# Patient Record
Sex: Female | Born: 1970 | Race: Black or African American | Hispanic: No | Smoking: Never smoker
Health system: Southern US, Community
[De-identification: ages and names within clinical notes are randomized; demographics above are authoritative.]

## PROBLEM LIST (undated history)

## (undated) DIAGNOSIS — F419 Anxiety disorder, unspecified: Secondary | ICD-10-CM

## (undated) DIAGNOSIS — F319 Bipolar disorder, unspecified: Secondary | ICD-10-CM

## (undated) DIAGNOSIS — F32A Depression, unspecified: Secondary | ICD-10-CM

## (undated) DIAGNOSIS — R569 Unspecified convulsions: Secondary | ICD-10-CM

## (undated) DIAGNOSIS — M1990 Unspecified osteoarthritis, unspecified site: Secondary | ICD-10-CM

## (undated) DIAGNOSIS — F329 Major depressive disorder, single episode, unspecified: Secondary | ICD-10-CM

## (undated) DIAGNOSIS — E079 Disorder of thyroid, unspecified: Secondary | ICD-10-CM

## (undated) HISTORY — PX: THYROIDECTOMY: SHX17

---

## 1994-12-21 ENCOUNTER — Emergency Department: Admit: 1994-12-21 | Payer: Self-pay

## 1995-02-10 ENCOUNTER — Emergency Department: Admit: 1995-02-10 | Payer: Self-pay | Source: Ambulatory Visit

## 1995-05-21 ENCOUNTER — Emergency Department: Admit: 1995-05-21 | Payer: Self-pay | Admitting: Psychiatry

## 1999-10-20 ENCOUNTER — Emergency Department: Admit: 1999-10-20 | Payer: Self-pay | Admitting: Emergency Medicine

## 2006-12-24 HISTORY — PX: ANTERIOR CRUCIATE LIGAMENT REPAIR: SHX115

## 2009-12-27 ENCOUNTER — Inpatient Hospital Stay (HOSPITAL_COMMUNITY): Admission: EM | Admit: 2009-12-27 | Discharge: 2009-12-30 | Payer: Self-pay | Admitting: Psychiatry

## 2009-12-27 ENCOUNTER — Ambulatory Visit: Payer: Self-pay | Admitting: Psychiatry

## 2010-09-29 ENCOUNTER — Encounter
Admission: RE | Admit: 2010-09-29 | Discharge: 2010-11-08 | Payer: Self-pay | Source: Home / Self Care | Admitting: Sports Medicine

## 2011-01-13 ENCOUNTER — Encounter: Payer: Self-pay | Admitting: Sports Medicine

## 2011-01-31 ENCOUNTER — Other Ambulatory Visit: Payer: Self-pay | Admitting: Sports Medicine

## 2011-01-31 DIAGNOSIS — M25561 Pain in right knee: Secondary | ICD-10-CM

## 2011-01-31 DIAGNOSIS — M545 Low back pain: Secondary | ICD-10-CM

## 2011-01-31 DIAGNOSIS — M25571 Pain in right ankle and joints of right foot: Secondary | ICD-10-CM

## 2011-02-03 ENCOUNTER — Other Ambulatory Visit: Payer: Self-pay

## 2011-02-17 ENCOUNTER — Ambulatory Visit
Admission: RE | Admit: 2011-02-17 | Discharge: 2011-02-17 | Disposition: A | Discharge status: Home / Self Care | Payer: Medicaid Other | Source: Ambulatory Visit | Attending: Sports Medicine | Admitting: Sports Medicine

## 2011-02-17 DIAGNOSIS — M545 Low back pain: Secondary | ICD-10-CM

## 2011-02-17 DIAGNOSIS — M25571 Pain in right ankle and joints of right foot: Secondary | ICD-10-CM

## 2011-02-17 DIAGNOSIS — M25561 Pain in right knee: Secondary | ICD-10-CM

## 2011-02-28 ENCOUNTER — Ambulatory Visit: Payer: Medicaid Other | Attending: Sports Medicine | Admitting: Physical Therapy

## 2011-02-28 DIAGNOSIS — M545 Low back pain, unspecified: Secondary | ICD-10-CM | POA: Insufficient documentation

## 2011-02-28 DIAGNOSIS — M6281 Muscle weakness (generalized): Secondary | ICD-10-CM | POA: Insufficient documentation

## 2011-02-28 DIAGNOSIS — M25569 Pain in unspecified knee: Secondary | ICD-10-CM | POA: Insufficient documentation

## 2011-02-28 DIAGNOSIS — IMO0001 Reserved for inherently not codable concepts without codable children: Secondary | ICD-10-CM | POA: Insufficient documentation

## 2011-03-11 LAB — COMPREHENSIVE METABOLIC PANEL
AST: 17 U/L (ref 0–37)
Albumin: 3.8 g/dL (ref 3.5–5.2)
Calcium: 9.1 mg/dL (ref 8.4–10.5)
Creatinine, Ser: 0.76 mg/dL (ref 0.4–1.2)
GFR calc Af Amer: >60 mL/min (ref >60)
Sodium: 139 mEq/L (ref 135–145)
Total Protein: 7.7 g/dL (ref 6.0–8.3)

## 2011-03-11 LAB — TSH: TSH: 1.874 u[IU]/mL (ref 0.350–4.500)

## 2011-03-11 LAB — VALPROIC ACID LEVEL: Valproic Acid Lvl: 64.5 ug/mL (ref 50.0–100.0)

## 2011-03-11 LAB — CBC
MCHC: 32.6 g/dL (ref 30.0–36.0)
MCV: 91.4 fL (ref 78.0–100.0)
Platelets: 158 10*3/uL (ref 150–400)

## 2011-03-11 LAB — SALICYLATE LEVEL: Salicylate Lvl: <4 mg/dL (ref 2.8–20.0)

## 2011-03-14 ENCOUNTER — Ambulatory Visit: Payer: Medicaid Other | Admitting: Physical Therapy

## 2011-03-20 ENCOUNTER — Ambulatory Visit: Payer: Medicaid Other | Admitting: Physical Therapy

## 2011-03-27 ENCOUNTER — Ambulatory Visit: Payer: Medicaid Other | Attending: Specialist | Admitting: Physical Therapy

## 2011-03-27 DIAGNOSIS — M6281 Muscle weakness (generalized): Secondary | ICD-10-CM | POA: Insufficient documentation

## 2011-03-27 DIAGNOSIS — M545 Low back pain, unspecified: Secondary | ICD-10-CM | POA: Insufficient documentation

## 2011-03-27 DIAGNOSIS — IMO0001 Reserved for inherently not codable concepts without codable children: Secondary | ICD-10-CM | POA: Insufficient documentation

## 2011-03-27 DIAGNOSIS — M25569 Pain in unspecified knee: Secondary | ICD-10-CM | POA: Insufficient documentation

## 2011-04-10 ENCOUNTER — Ambulatory Visit: Payer: Medicaid Other | Admitting: Physical Therapy

## 2011-05-03 ENCOUNTER — Emergency Department (HOSPITAL_BASED_OUTPATIENT_CLINIC_OR_DEPARTMENT_OTHER)
Admission: EM | Admit: 2011-05-03 | Discharge: 2011-05-03 | Disposition: A | Discharge status: Home / Self Care | Payer: Medicaid Other | Attending: Emergency Medicine | Admitting: Emergency Medicine

## 2011-05-03 DIAGNOSIS — R112 Nausea with vomiting, unspecified: Secondary | ICD-10-CM | POA: Insufficient documentation

## 2011-05-03 DIAGNOSIS — F319 Bipolar disorder, unspecified: Secondary | ICD-10-CM | POA: Insufficient documentation

## 2011-05-03 DIAGNOSIS — R197 Diarrhea, unspecified: Secondary | ICD-10-CM | POA: Insufficient documentation

## 2011-05-03 DIAGNOSIS — N72 Inflammatory disease of cervix uteri: Secondary | ICD-10-CM | POA: Insufficient documentation

## 2011-05-03 LAB — CBC
HCT: 40.3 % (ref 36.0–46.0)
MCV: 81.4 fL (ref 78.0–100.0)
RBC: 4.95 MIL/uL (ref 3.87–5.11)
RDW: 12.7 % (ref 11.5–15.5)
WBC: 5.4 10*3/uL (ref 4.0–10.5)

## 2011-05-03 LAB — COMPREHENSIVE METABOLIC PANEL
Alkaline Phosphatase: 64 U/L (ref 39–117)
BUN: 6 mg/dL (ref 6–23)
Chloride: 98 mEq/L (ref 96–112)
Glucose, Bld: 98 mg/dL (ref 70–99)
Potassium: 3.7 mEq/L (ref 3.5–5.1)
Total Bilirubin: 0.3 mg/dL (ref 0.3–1.2)

## 2011-05-03 LAB — URINALYSIS, ROUTINE W REFLEX MICROSCOPIC
Glucose, UA: NEGATIVE mg/dL
Ketones, ur: NEGATIVE mg/dL
Protein, ur: NEGATIVE mg/dL
Urobilinogen, UA: 0.2 mg/dL (ref 0.0–1.0)

## 2011-05-03 LAB — DIFFERENTIAL
Eosinophils Relative: 0 % (ref 0–5)
Lymphocytes Relative: 7 % — ABNORMAL LOW (ref 12–46)
Lymphs Abs: 0.4 10*3/uL — ABNORMAL LOW (ref 0.7–4.0)
Monocytes Relative: 12 % (ref 3–12)

## 2011-05-03 LAB — PREGNANCY, URINE: Preg Test, Ur: NEGATIVE

## 2011-05-03 LAB — URINE MICROSCOPIC-ADD ON

## 2011-05-03 LAB — WET PREP, GENITAL: Yeast Wet Prep HPF POC: NONE SEEN

## 2011-05-03 LAB — LIPASE, BLOOD: Lipase: 18 U/L (ref 11–59)

## 2011-08-18 ENCOUNTER — Emergency Department (INDEPENDENT_AMBULATORY_CARE_PROVIDER_SITE_OTHER): Payer: Medicaid Other

## 2011-08-18 ENCOUNTER — Emergency Department (HOSPITAL_BASED_OUTPATIENT_CLINIC_OR_DEPARTMENT_OTHER)
Admission: EM | Admit: 2011-08-18 | Discharge: 2011-08-18 | Disposition: A | Discharge status: Home / Self Care | Payer: Medicaid Other | Attending: Emergency Medicine | Admitting: Emergency Medicine

## 2011-08-18 ENCOUNTER — Other Ambulatory Visit (HOSPITAL_BASED_OUTPATIENT_CLINIC_OR_DEPARTMENT_OTHER): Payer: Medicaid Other

## 2011-08-18 ENCOUNTER — Encounter: Payer: Self-pay | Admitting: *Deleted

## 2011-08-18 DIAGNOSIS — F411 Generalized anxiety disorder: Secondary | ICD-10-CM | POA: Insufficient documentation

## 2011-08-18 DIAGNOSIS — J45909 Unspecified asthma, uncomplicated: Secondary | ICD-10-CM | POA: Insufficient documentation

## 2011-08-18 DIAGNOSIS — Z79899 Other long term (current) drug therapy: Secondary | ICD-10-CM | POA: Insufficient documentation

## 2011-08-18 DIAGNOSIS — M940 Chondrocostal junction syndrome [Tietze]: Secondary | ICD-10-CM

## 2011-08-18 DIAGNOSIS — R079 Chest pain, unspecified: Secondary | ICD-10-CM | POA: Insufficient documentation

## 2011-08-18 DIAGNOSIS — E079 Disorder of thyroid, unspecified: Secondary | ICD-10-CM | POA: Insufficient documentation

## 2011-08-18 DIAGNOSIS — F319 Bipolar disorder, unspecified: Secondary | ICD-10-CM | POA: Insufficient documentation

## 2011-08-18 DIAGNOSIS — N390 Urinary tract infection, site not specified: Secondary | ICD-10-CM | POA: Insufficient documentation

## 2011-08-18 DIAGNOSIS — R1011 Right upper quadrant pain: Secondary | ICD-10-CM | POA: Insufficient documentation

## 2011-08-18 HISTORY — DX: Anxiety disorder, unspecified: F41.9

## 2011-08-18 HISTORY — DX: Disorder of thyroid, unspecified: E07.9

## 2011-08-18 HISTORY — DX: Bipolar disorder, unspecified: F31.9

## 2011-08-18 LAB — URINALYSIS, ROUTINE W REFLEX MICROSCOPIC
Ketones, ur: NEGATIVE mg/dL
Nitrite: NEGATIVE
Specific Gravity, Urine: 1.019 (ref 1.005–1.030)
pH: 6 (ref 5.0–8.0)

## 2011-08-18 LAB — CBC
Hemoglobin: 14 g/dL (ref 12.0–15.0)
MCH: 28.9 pg (ref 26.0–34.0)
MCV: 85.2 fL (ref 78.0–100.0)
RBC: 4.85 MIL/uL (ref 3.87–5.11)

## 2011-08-18 LAB — DIFFERENTIAL
Basophils Absolute: 0 10*3/uL (ref 0.0–0.1)
Basophils Relative: 0 % (ref 0–1)
Eosinophils Relative: 2 % (ref 0–5)
Monocytes Absolute: 0.4 10*3/uL (ref 0.1–1.0)

## 2011-08-18 LAB — COMPREHENSIVE METABOLIC PANEL
AST: 19 U/L (ref 0–37)
Albumin: 3.6 g/dL (ref 3.5–5.2)
Calcium: 9.8 mg/dL (ref 8.4–10.5)
Creatinine, Ser: 0.7 mg/dL (ref 0.50–1.10)

## 2011-08-18 LAB — D-DIMER, QUANTITATIVE: D-Dimer, Quant: 0.45 ug/mL-FEU (ref 0.00–0.48)

## 2011-08-18 LAB — URINE MICROSCOPIC-ADD ON

## 2011-08-18 MED ORDER — NAPROXEN 500 MG PO TABS: 500.0000 mg | ORAL_TABLET | Freq: Two times a day (BID) | ORAL | Status: AC

## 2011-08-18 MED ORDER — HYDROCODONE-ACETAMINOPHEN 5-500 MG PO TABS: 1.0000 | ORAL_TABLET | Freq: Four times a day (QID) | ORAL | Status: AC | PRN

## 2011-08-18 MED ORDER — MORPHINE SULFATE 4 MG/ML IJ SOLN
4.0000 mg | Freq: Once | INTRAMUSCULAR | Status: AC
Start: 2011-08-18 — End: 2011-08-18
  Administered 2011-08-18: 4 mg via INTRAVENOUS
  Filled 2011-08-18: qty 1

## 2011-08-18 MED ORDER — KETOROLAC TROMETHAMINE 30 MG/ML IJ SOLN
30.0000 mg | Freq: Once | INTRAMUSCULAR | Status: AC
Start: 2011-08-18 — End: 2011-08-18
  Administered 2011-08-18: 30 mg via INTRAVENOUS
  Filled 2011-08-18: qty 1

## 2011-08-18 MED ORDER — SODIUM CHLORIDE 0.9 % IV BOLUS (SEPSIS)
1000.0000 mL | Freq: Once | INTRAVENOUS | Status: AC
  Administered 2011-08-18: 1000 mL via INTRAVENOUS

## 2011-08-18 MED ORDER — NITROFURANTOIN MONOHYD MACRO 100 MG PO CAPS: 100.0000 mg | ORAL_CAPSULE | Freq: Two times a day (BID) | ORAL | Status: AC

## 2011-08-18 MED ORDER — NITROFURANTOIN MONOHYD MACRO 100 MG PO CAPS
100.0000 mg | ORAL_CAPSULE | Freq: Once | ORAL | Status: AC
  Administered 2011-08-18: 100 mg via ORAL
  Filled 2011-08-18: qty 1

## 2011-08-18 NOTE — ED Notes (Signed)
Pt describes heaviness in chest onset 11:00 a.m. While lying down. Hurts to move or take a deep breath. Also c/o H/A (migraine)

## 2011-08-18 NOTE — ED Provider Notes (Signed)
History   Chart scribed for Dayton Bailiff, MD by Dayton Bailiff; the patient was seen in room MH06/MH06; this patient's care was started at 7:37 PM.    CSN: [id number] Arrival date & time: 08/18/2011  7:03 PM  Chief Complaint  Patient presents with  . Chest Pain   HPI Rebecca Solis is a 40 y.o. female who presents to the Emergency Department complaining of chest pain. Pt c/o right sided chest pain through to back and RUQ abd was sudden in onset while lying down approx 9 hours ago. Pt describes chest pain as a heaviness that is worse with movement and deep breathing. Pain not worse with eating. No n/v, sob, cough, or congestion. Also c/o HA x 3 days that is same as usual migraines.   PAST MEDICAL HISTORY:  Past Medical History  Diagnosis Date  . Asthma   . Thyroid disease   . Bipolar affective disorder   . Anxiety      PAST SURGICAL HISTORY:  Past Surgical History  Procedure Date  . Thyroidectomy     MEDICATIONS:  Previous Medications   IBUPROFEN (ADVIL,MOTRIN) 800 MG TABLET    Take 800 mg by mouth once as needed. For pain     LEVONORGESTREL (MIRENA) 20 MCG/24HR IUD    1 each by Intrauterine route once. Inserted September 2008    QUETIAPINE (SEROQUEL) 200 MG TABLET    Take 400 mg by mouth daily.     ZOLPIDEM (AMBIEN) 10 MG TABLET    Take 10 mg by mouth at bedtime as needed. For sleep       ALLERGIES:  Allergies as of 08/18/2011  . (No Known Allergies)     FAMILY HISTORY:  History reviewed. No pertinent family history.   SOCIAL HISTORY: History   Social History  . Marital Status: Single    Spouse Name: N/A    Number of Children: N/A  . Years of Education: N/A   Occupational History  . Not on file.   Social History Main Topics  . Smoking status: Former Games developer  . Smokeless tobacco: Not on file  . Alcohol Use: No  . Drug Use: No  . Sexually Active: Yes    Birth Control/ Protection: IUD   Other Topics Concern  . Not on file   Social History Narrative  .  No narrative on file       Review of Systems 10 Systems reviewed and are negative for acute change except as noted in the HPI.  Physical Exam  BP 120/78  Pulse 88  Temp(Src) 98 F (36.7 C) (Oral)  Resp 20  Ht 5\' 7"  (1.702 m)  Wt 180 lb (81.647 kg)  BMI 28.19 kg/m2  SpO2 100%  LMP 07/28/2011  Physical Exam  Nursing note and vitals reviewed. Constitutional: She is oriented to person, place, and time. She appears well-developed and well-nourished. No distress.       Uncomfortable appearing  HENT:  Head: Normocephalic.  Mouth/Throat: Mucous membranes are normal.  Eyes: Conjunctivae are normal.  Neck: Normal range of motion. Neck supple.  Cardiovascular: Normal rate, regular rhythm and intact distal pulses.  Exam reveals no gallop and no friction rub.   No murmur heard. Pulmonary/Chest: Effort normal and breath sounds normal. She has no wheezes. She has no rales. She exhibits no tenderness.  Abdominal: Soft. Bowel sounds are normal. There is tenderness (RUQ).       Right CVA tenderness  Musculoskeletal: Normal range of motion.  Neurological: She is alert  and oriented to person, place, and time.  Skin: Skin is warm and dry. No rash noted.  Psychiatric: She has a normal mood and affect.    ED Course  Procedures  OTHER DATA REVIEWED: Nursing notes and vital signs reviewed. Prior records reviewed.   DIAGNOSTIC STUDIES: Oxygen Saturation is 100% on room air, normal by my Interpretation.   LABS / RADIOLOGY: Results for orders placed during the hospital encounter of 08/18/11  CBC      Component Value Range   WBC 4.5  4.0 - 10.5 (K/uL)   RBC 4.85  3.87 - 5.11 (MIL/uL)   Hemoglobin 14.0  12.0 - 15.0 (g/dL)   HCT 14.7  82.9 - 56.2 (%)   MCV 85.2  78.0 - 100.0 (fL)   MCH 28.9  26.0 - 34.0 (pg)   MCHC 33.9  30.0 - 36.0 (g/dL)   RDW 13.0  86.5 - 78.4 (%)   Platelets 193  150 - 400 (K/uL)  DIFFERENTIAL      Component Value Range   Neutrophils Relative 42 (*) 43 - 77  (%)   Neutro Abs 1.9  1.7 - 7.7 (K/uL)   Lymphocytes Relative 46  12 - 46 (%)   Lymphs Abs 2.1  0.7 - 4.0 (K/uL)   Monocytes Relative 10  3 - 12 (%)   Monocytes Absolute 0.4  0.1 - 1.0 (K/uL)   Eosinophils Relative 2  0 - 5 (%)   Eosinophils Absolute 0.1  0.0 - 0.7 (K/uL)   Basophils Relative 0  0 - 1 (%)   Basophils Absolute 0.0  0.0 - 0.1 (K/uL)  COMPREHENSIVE METABOLIC PANEL      Component Value Range   Sodium 138  135 - 145 (mEq/L)   Potassium 3.8  3.5 - 5.1 (mEq/L)   Chloride 103  96 - 112 (mEq/L)   CO2 24  19 - 32 (mEq/L)   Glucose, Bld 83  70 - 99 (mg/dL)   BUN 9  6 - 23 (mg/dL)   Creatinine, Ser 6.96  0.50 - 1.10 (mg/dL)   Calcium 9.8  8.4 - 29.5 (mg/dL)   Total Protein 7.6  6.0 - 8.3 (g/dL)   Albumin 3.6  3.5 - 5.2 (g/dL)   AST 19  0 - 37 (U/L)   ALT 10  0 - 35 (U/L)   Alkaline Phosphatase 55  39 - 117 (U/L)   Total Bilirubin 0.3  0.3 - 1.2 (mg/dL)   GFR calc non Af Amer >60  >60 (mL/min)   GFR calc Af Amer >60  >60 (mL/min)  LIPASE, BLOOD      Component Value Range   Lipase 29  11 - 59 (U/L)  D-DIMER, QUANTITATIVE      Component Value Range   D-Dimer, Quant 0.45  0.00 - 0.48 (ug/mL-FEU)  URINALYSIS, ROUTINE W REFLEX MICROSCOPIC      Component Value Range   Color, Urine YELLOW  YELLOW    Appearance CLOUDY (*) CLEAR    Specific Gravity, Urine 1.019  1.005 - 1.030    pH 6.0  5.0 - 8.0    Glucose, UA NEGATIVE  NEGATIVE (mg/dL)   Hgb urine dipstick NEGATIVE  NEGATIVE    Bilirubin Urine NEGATIVE  NEGATIVE    Ketones, ur NEGATIVE  NEGATIVE (mg/dL)   Protein, ur NEGATIVE  NEGATIVE (mg/dL)   Urobilinogen, UA 0.2  0.0 - 1.0 (mg/dL)   Nitrite NEGATIVE  NEGATIVE    Leukocytes, UA MODERATE (*) NEGATIVE   URINE  MICROSCOPIC-ADD ON      Component Value Range   Squamous Epithelial / LPF MANY (*) RARE    WBC, UA 3-6  <3 (WBC/hpf)   Bacteria, UA MANY (*) RARE    Dg Chest 2 View  08/18/2011  *RADIOLOGY REPORT*  Clinical Data: Chest pain  CHEST - 2 VIEW  Comparison: None.   Findings: Cardiac and mediastinal contours are normal.  Vascularity is normal.  Lungs are clear without infiltrate or effusion.  IMPRESSION: No active cardiopulmonary disease.  Original Report Authenticated By: Camelia Phenes, M.D.    ED COURSE: Patient received pain medication one emergency department. She had improvement of her symptoms. She had a son have a urinary tract infection which was treated with Macrobid. I explained the results of the testing to the patient explained her diagnosis of costochondritis urinary tract infection. The patient had no further questions. I encouraged the importance of followup  MDM: Life-threatening causes of chest pain were considered such as acute coronary syndrome, pulmonary embolism. A d-dimer was performed and negative. Based on the type of pain, location, reproducibility this is not consistent with coronary artery disease. Patient is also much too young. EKG was performed and unremarkable. Chest x-ray was negative. Patient is safe for discharge home. She is instructed to followup.   Date: 08/18/2011  Rate: 88  Rhythm: normal sinus rhythm  QRS Axis: normal  Intervals: normal  ST/T Wave abnormalities: normal  Conduction Disutrbances:none  Narrative Interpretation:   Old EKG Reviewed: none available  IMPRESSION: 1. Urinary tract infection   2. Costochondritis      PLAN: discharge All results reviewed and discussed with pt, questions answered, pt agreeable with plan.   CONDITION ON DISCHARGE: stable   MEDS GIVEN IN ED: sodium chloride 0.9 % bolus 1,000 mL (1000 mL Intravenous Given 08/18/11 1951)  ketorolac (TORADOL) injection 30 mg (30 mg Intravenous Given 08/18/11 1952)  morphine injection 4 mg (4 mg Intravenous Given 08/18/11 1952)  nitrofurantoin (macrocrystal-monohydrate) (MACROBID) capsule 100 mg (100 mg Oral Given 08/18/11 2045)     DISCHARGE MEDICATIONS: New Prescriptions   HYDROCODONE-ACETAMINOPHEN (VICODIN) 5-500 MG PER TABLET     Take 1-2 tablets by mouth every 6 (six) hours as needed for pain.   NAPROXEN (NAPROSYN) 500 MG TABLET    Take 1 tablet (500 mg total) by mouth 2 (two) times daily.   NITROFURANTOIN, MACROCRYSTAL-MONOHYDRATE, (MACROBID) 100 MG CAPSULE    Take 1 capsule (100 mg total) by mouth 2 (two) times daily.     SCRIBE ATTESTATION: I personally performed the services described in this documentation, which was scribed in my presence. The recorded information has been reviewed and considered.      Dayton Bailiff, MD 08/18/11 2228

## 2012-04-26 IMAGING — CR DG CHEST 2V
2 series · 2 of 2 positions shown · non-contrast
Comparison: None.

CLINICAL DATA: Chest pain

CHEST - 2 VIEW

[w chest pa]
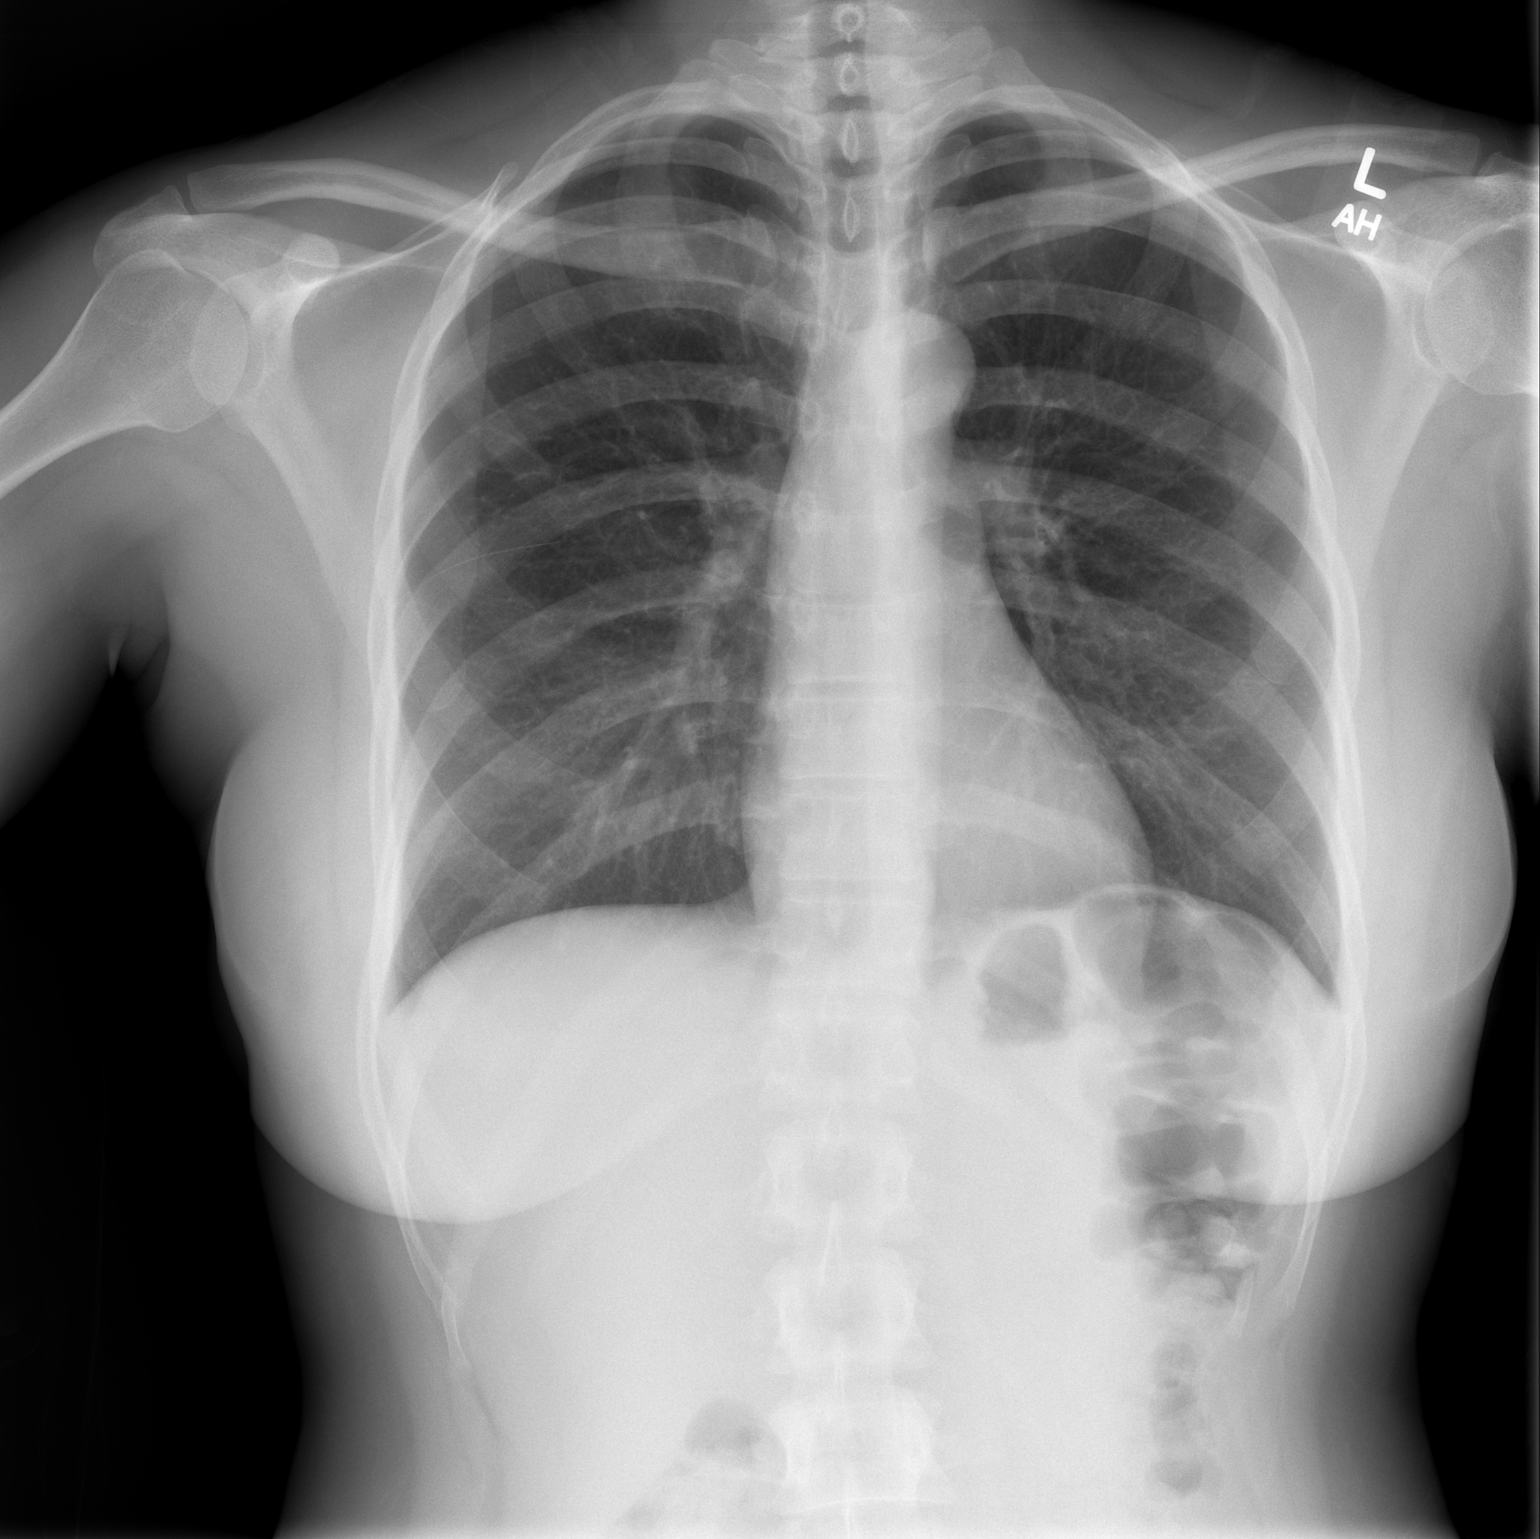

[w chest lat]
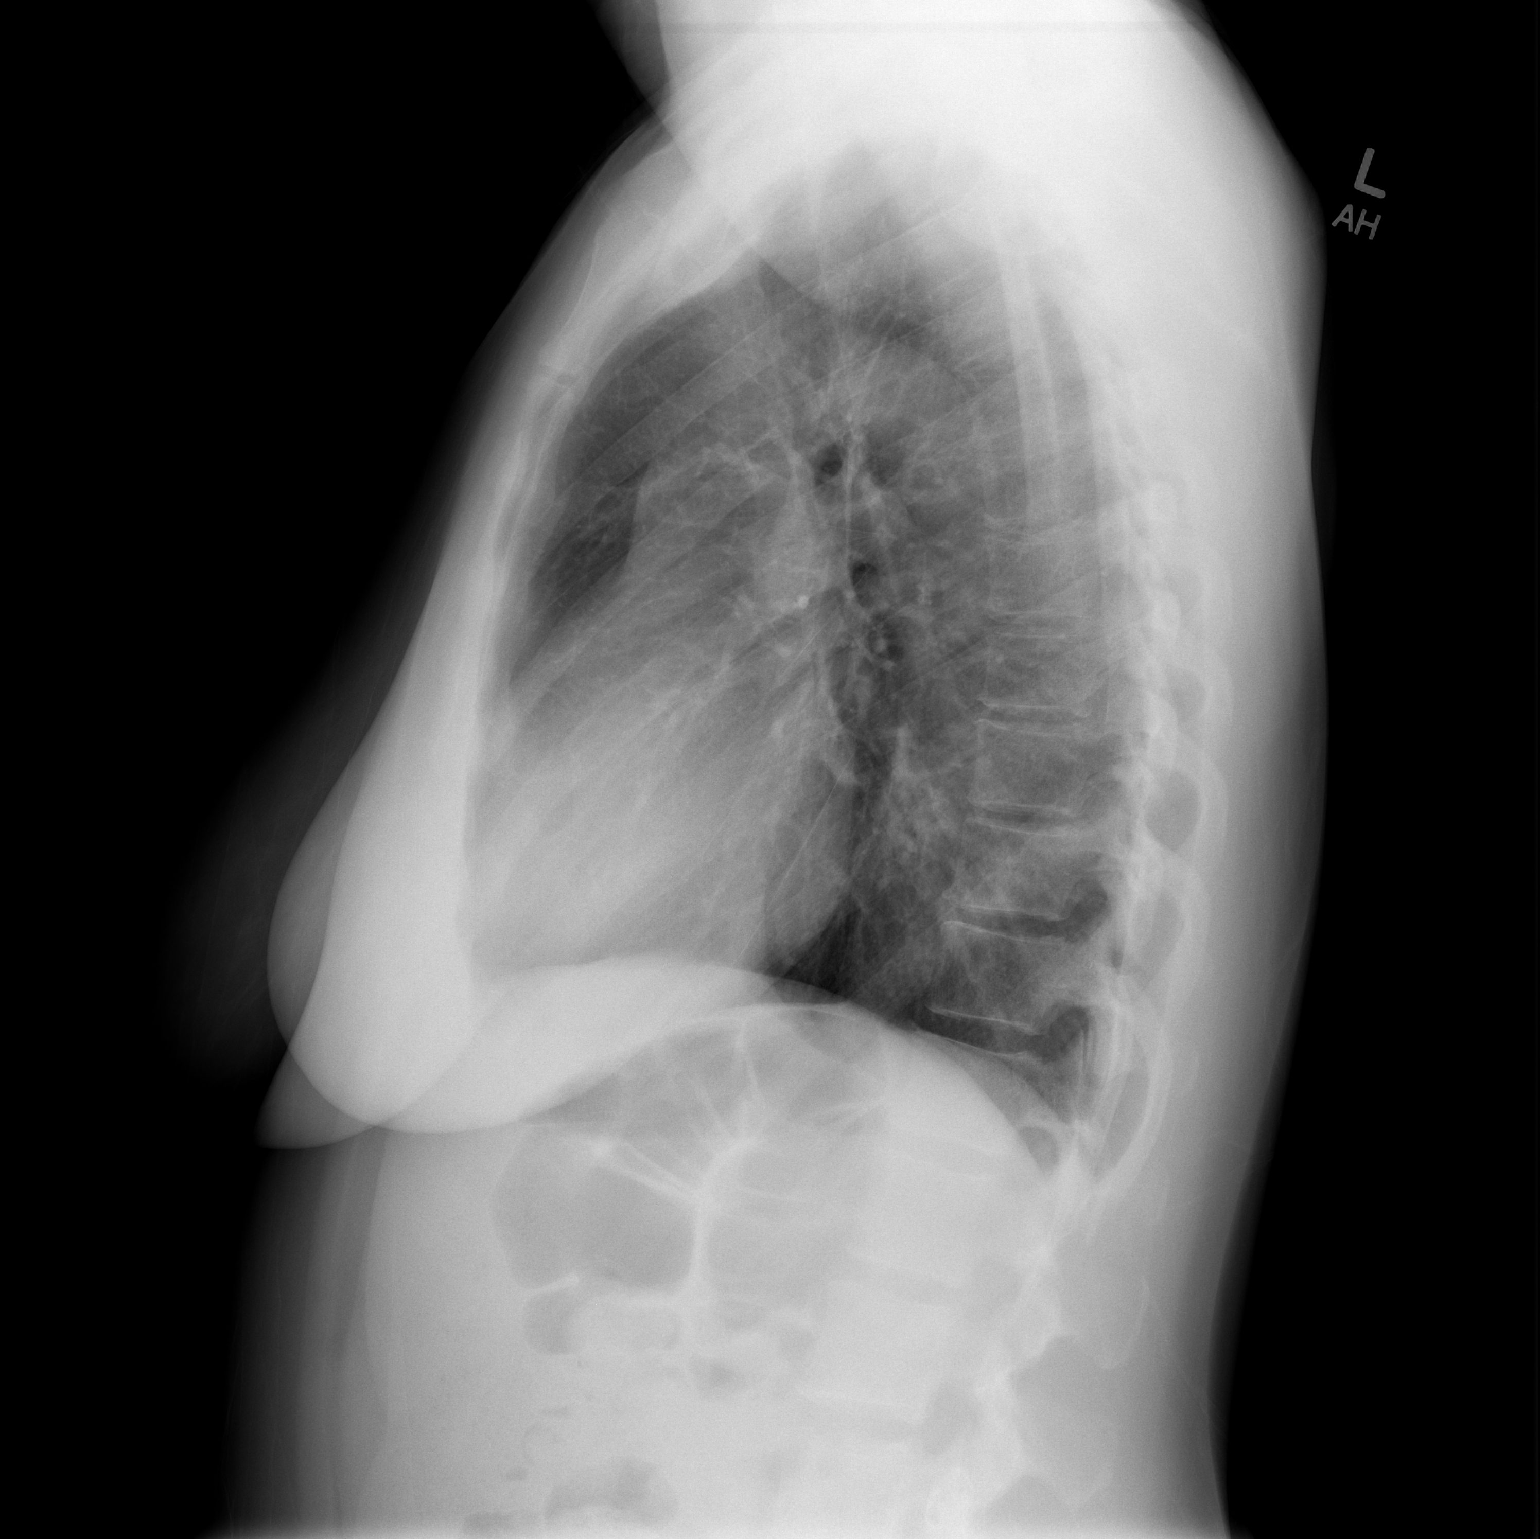

[2 of 2 positions shown; findings below may reference images not displayed]

FINDINGS: Cardiac and mediastinal contours are normal.  Vascularity
is normal.  Lungs are clear without infiltrate or effusion.
IMPRESSION: No active cardiopulmonary disease.

## 2012-08-24 HISTORY — PX: LEEP: SHX91

## 2013-06-22 ENCOUNTER — Encounter (HOSPITAL_COMMUNITY): Payer: Self-pay | Admitting: *Deleted

## 2013-06-22 ENCOUNTER — Emergency Department (HOSPITAL_COMMUNITY)
Admission: EM | Admit: 2013-06-22 | Discharge: 2013-06-23 | Disposition: A | Discharge status: Other Acute Inpatient Hospital | Payer: Medicaid Other | Attending: Emergency Medicine | Admitting: Emergency Medicine

## 2013-06-22 DIAGNOSIS — Z79899 Other long term (current) drug therapy: Secondary | ICD-10-CM | POA: Insufficient documentation

## 2013-06-22 DIAGNOSIS — F339 Major depressive disorder, recurrent, unspecified: Secondary | ICD-10-CM

## 2013-06-22 DIAGNOSIS — Z3202 Encounter for pregnancy test, result negative: Secondary | ICD-10-CM | POA: Insufficient documentation

## 2013-06-22 DIAGNOSIS — Z87891 Personal history of nicotine dependence: Secondary | ICD-10-CM | POA: Insufficient documentation

## 2013-06-22 DIAGNOSIS — J45909 Unspecified asthma, uncomplicated: Secondary | ICD-10-CM | POA: Insufficient documentation

## 2013-06-22 DIAGNOSIS — Z862 Personal history of diseases of the blood and blood-forming organs and certain disorders involving the immune mechanism: Secondary | ICD-10-CM | POA: Insufficient documentation

## 2013-06-22 DIAGNOSIS — F411 Generalized anxiety disorder: Secondary | ICD-10-CM | POA: Insufficient documentation

## 2013-06-22 DIAGNOSIS — M549 Dorsalgia, unspecified: Secondary | ICD-10-CM | POA: Insufficient documentation

## 2013-06-22 DIAGNOSIS — Z8639 Personal history of other endocrine, nutritional and metabolic disease: Secondary | ICD-10-CM | POA: Insufficient documentation

## 2013-06-22 DIAGNOSIS — F319 Bipolar disorder, unspecified: Secondary | ICD-10-CM | POA: Insufficient documentation

## 2013-06-22 DIAGNOSIS — F39 Unspecified mood [affective] disorder: Secondary | ICD-10-CM | POA: Insufficient documentation

## 2013-06-22 DIAGNOSIS — F3132 Bipolar disorder, current episode depressed, moderate: Secondary | ICD-10-CM

## 2013-06-22 LAB — SALICYLATE LEVEL: Salicylate Lvl: <2 mg/dL — ABNORMAL LOW (ref 2.8–20.0)

## 2013-06-22 LAB — RAPID URINE DRUG SCREEN, HOSP PERFORMED
Cocaine: NOT DETECTED
Opiates: NOT DETECTED

## 2013-06-22 LAB — COMPREHENSIVE METABOLIC PANEL
ALT: 8 U/L (ref 0–35)
BUN: 8 mg/dL (ref 6–23)
Calcium: 9.5 mg/dL (ref 8.4–10.5)
Creatinine, Ser: 0.73 mg/dL (ref 0.50–1.10)
GFR calc Af Amer: >90 mL/min (ref >90)
Glucose, Bld: 102 mg/dL — ABNORMAL HIGH (ref 70–99)
Sodium: 137 mEq/L (ref 135–145)
Total Protein: 7.6 g/dL (ref 6.0–8.3)

## 2013-06-22 LAB — CBC
Hemoglobin: 13.9 g/dL (ref 12.0–15.0)
MCH: 27.5 pg (ref 26.0–34.0)
MCHC: 33.4 g/dL (ref 30.0–36.0)
MCV: 82.4 fL (ref 78.0–100.0)

## 2013-06-22 LAB — ETHANOL: Alcohol, Ethyl (B): <11 mg/dL (ref 0–11)

## 2013-06-22 MED ORDER — LORATADINE 10 MG PO TABS
10.0000 mg | ORAL_TABLET | Freq: Every day | ORAL | Status: DC
  Administered 2013-06-22 – 2013-06-23 (×2): 10 mg via ORAL
  Filled 2013-06-22 (×2): qty 1

## 2013-06-22 MED ORDER — ZIPRASIDONE MESYLATE 20 MG IM SOLR
20.0000 mg | Freq: Once | INTRAMUSCULAR | Status: AC
  Administered 2013-06-22: 20 mg via INTRAMUSCULAR
  Filled 2013-06-22: qty 20

## 2013-06-22 MED ORDER — LORAZEPAM 1 MG PO TABS
1.0000 mg | ORAL_TABLET | Freq: Once | ORAL | Status: AC
  Administered 2013-06-22: 1 mg via SUBLINGUAL
  Filled 2013-06-22: qty 1

## 2013-06-22 MED ORDER — ALBUTEROL SULFATE HFA 108 (90 BASE) MCG/ACT IN AERS: 2.0000 | INHALATION_SPRAY | Freq: Four times a day (QID) | RESPIRATORY_TRACT | Status: DC | PRN

## 2013-06-22 MED ORDER — DULOXETINE HCL 30 MG PO CPEP
30.0000 mg | ORAL_CAPSULE | Freq: Every day | ORAL | Status: DC
  Administered 2013-06-22 – 2013-06-23 (×2): 30 mg via ORAL
  Filled 2013-06-22 (×2): qty 1

## 2013-06-22 MED ORDER — LORAZEPAM 1 MG PO TABS: 2.0000 mg | ORAL_TABLET | Freq: Four times a day (QID) | ORAL | Status: DC | PRN

## 2013-06-22 NOTE — Consult Note (Deleted)
Rebecca Solis is an 42 y.o. female.  HPI:   Past Medical History  Diagnosis Date  . Asthma   . Thyroid disease   . Bipolar affective disorder   . Anxiety     Past Surgical History  Procedure Laterality Date  . Thyroidectomy      History reviewed. No pertinent family history.  Social History:  reports that she has quit smoking. She does not have any smokeless tobacco history on file. She reports that she does not drink alcohol or use illicit drugs.  Allergies: No Known Allergies  Medications: I have reviewed the patient's current medications.  Results for orders placed during the hospital encounter of 06/22/13 (from the past 48 hour(s))  ACETAMINOPHEN LEVEL     Status: None   Collection Time    06/22/13  2:25 PM      Result Value Range   Acetaminophen (Tylenol), Serum <15.0  10 - 30 ug/mL   Comment:            THERAPEUTIC CONCENTRATIONS VARY     SIGNIFICANTLY. A RANGE OF 10-30     ug/mL MAY BE AN EFFECTIVE     CONCENTRATION FOR MANY PATIENTS.     HOWEVER, SOME ARE BEST TREATED     AT CONCENTRATIONS OUTSIDE THIS     RANGE.     ACETAMINOPHEN CONCENTRATIONS     >150 ug/mL AT 4 HOURS AFTER     INGESTION AND >50 ug/mL AT 12     HOURS AFTER INGESTION ARE     OFTEN ASSOCIATED WITH TOXIC     REACTIONS.  CBC     Status: None   Collection Time    06/22/13  2:25 PM      Result Value Range   WBC 5.0  4.0 - 10.5 K/uL   RBC 5.05  3.87 - 5.11 MIL/uL   Hemoglobin 13.9  12.0 - 15.0 g/dL   HCT 16.1  09.6 - 04.5 %   MCV 82.4  78.0 - 100.0 fL   MCH 27.5  26.0 - 34.0 pg   MCHC 33.4  30.0 - 36.0 g/dL   RDW 40.9  81.1 - 91.4 %   Platelets 257  150 - 400 K/uL  COMPREHENSIVE METABOLIC PANEL     Status: Abnormal   Collection Time    06/22/13  2:25 PM      Result Value Range   Sodium 137  135 - 145 mEq/L   Potassium 3.7  3.5 - 5.1 mEq/L   Chloride 104  96 - 112 mEq/L   CO2 22  19 - 32 mEq/L   Glucose, Bld 102 (*) 70 - 99 mg/dL   BUN 8  6 - 23 mg/dL   Creatinine, Ser 7.82   0.50 - 1.10 mg/dL   Calcium 9.5  8.4 - 95.6 mg/dL   Total Protein 7.6  6.0 - 8.3 g/dL   Albumin 3.7  3.5 - 5.2 g/dL   AST 12  0 - 37 U/L   ALT 8  0 - 35 U/L   Alkaline Phosphatase 64  39 - 117 U/L   Total Bilirubin 0.3  0.3 - 1.2 mg/dL   GFR calc non Af Amer >90  >90 mL/min   GFR calc Af Amer >90  >90 mL/min   Comment:            The eGFR has been calculated     using the CKD EPI equation.     This calculation has not been  validated in all clinical     situations.     eGFR's persistently     <90 mL/min signify     possible Chronic Kidney Disease.  ETHANOL     Status: None   Collection Time    06/22/13  2:25 PM      Result Value Range   Alcohol, Ethyl (B) <11  0 - 11 mg/dL   Comment:            LOWEST DETECTABLE LIMIT FOR     SERUM ALCOHOL IS 11 mg/dL     FOR MEDICAL PURPOSES ONLY  SALICYLATE LEVEL     Status: Abnormal   Collection Time    06/22/13  2:25 PM      Result Value Range   Salicylate Lvl <2.0 (*) 2.8 - 20.0 mg/dL  POCT PREGNANCY, URINE     Status: None   Collection Time    06/22/13  2:29 PM      Result Value Range   Preg Test, Ur NEGATIVE  NEGATIVE   Comment:            THE SENSITIVITY OF THIS     METHODOLOGY IS >24 mIU/mL  URINE RAPID DRUG SCREEN (HOSP PERFORMED)     Status: Abnormal   Collection Time    06/22/13  2:31 PM      Result Value Range   Opiates NONE DETECTED  NONE DETECTED   Cocaine NONE DETECTED  NONE DETECTED   Benzodiazepines NONE DETECTED  NONE DETECTED   Amphetamines NONE DETECTED  NONE DETECTED   Tetrahydrocannabinol POSITIVE (*) NONE DETECTED   Barbiturates NONE DETECTED  NONE DETECTED   Comment:            DRUG SCREEN FOR MEDICAL PURPOSES     ONLY.  IF CONFIRMATION IS NEEDED     FOR ANY PURPOSE, NOTIFY LAB     WITHIN 5 DAYS.                LOWEST DETECTABLE LIMITS     FOR URINE DRUG SCREEN     Drug Class       Cutoff (ng/mL)     Amphetamine      1000     Barbiturate      200     Benzodiazepine   200     Tricyclics        300     Opiates          300     Cocaine          300     THC              50    No results found.  Review of Systems   Blood pressure 101/70, pulse 74, temperature 98.4 F (36.9 C), temperature source Oral, resp. rate 18, SpO2 100.00%. Physical Exam  Constitutional: She is oriented to person, place, and time. She appears well-developed.  Neck: Normal range of motion.  Respiratory: Effort normal.  Musculoskeletal: Normal range of motion.  Neurological: She is alert and oriented to person, place, and time.  Skin: Skin is warm and dry.

## 2013-06-22 NOTE — ED Notes (Signed)
Writer contacted Dr. Blinda Leatherwood to inform that patient has increase anxiety, increase crying and pulling and tearing at srubs.

## 2013-06-22 NOTE — ED Notes (Signed)
Pt reports hx of depression and bipolar. Has not taken bipolar medications in 1 year. Pt ran out of cymbalta medication 3 days ago. Pt reports knee and back pain. Pt tearful. Pt reprots SI, attempted suicide by overdose1 year ago. Pts friend reports pt said she wanted to slit her throat with a knife. Pt also HI. Friend said pt reported she also mentioned she wanted to kill everyone else before herself. Denies ETOH use, marijuana use yesterday.

## 2013-06-22 NOTE — Progress Notes (Signed)
   CARE MANAGEMENT ED NOTE 06/22/2013  Patient:  COLIE, JOSTEN   Account Number:  [id number]  Date Initiated:  06/22/2013  Documentation initiated by:  Radford Pax  Subjective/Objective Assessment:     Subjective/Objective Assessment Detail:     Action/Plan:   Action/Plan Detail:   Anticipated DC Date:       Status Recommendation to Physician:   Result of Recommendation:    Other ED Services  Consult Working Plan    DC Planning Services  Other  PCP issues    Choice offered to / List presented to:            Status of service:  Completed, signed off  ED Comments:   ED Comments Detail:  Patient states her pcp is dr. Zoe Lan.  Patient also reports her psychiatrist is Dr. Inda Coke, patient is unsure of the spelling.

## 2013-06-22 NOTE — ED Notes (Signed)
Sitter at bedside.

## 2013-06-22 NOTE — ED Notes (Signed)
Patient crying in room and ripping up paper shirt. Patient also yelling out and shaking bed. Writer went to patient bedside to console patient but Clinical research associate was unable to console patient. Patient states" I came here for depression and this room is making me more depressed".

## 2013-06-22 NOTE — ED Notes (Signed)
Pt very tearful and stating that she needs something for her anxiety. "the man making all that noise is really making my anxiety worse and I need something to eat right now!"  Made Rebecca Solis EDP aware.  New orders given.  Secretary ordered pt meal tray

## 2013-06-22 NOTE — ED Notes (Signed)
Pt's father left phone number.  Arsenio Katz (cell) [phone] and (home) [phone]

## 2013-06-22 NOTE — ED Notes (Signed)
rn called [facility] and alerted that we need suicide sitter for pt.

## 2013-06-22 NOTE — ED Provider Notes (Addendum)
History    CSN: [id number] Arrival date & time 06/22/13  1337  First MD Initiated Contact with Patient 06/22/13 1452     Chief Complaint  Patient presents with  . Suicidal   (Consider location/radiation/quality/duration/timing/severity/associated sxs/prior Treatment) HPI Comments: Patient comes in here for evaluation of depression. Patient has a history of bipolar disorder with depression requiring hospitalization secondary to suicide attempt. Patient reports she has been off her medications for one year. She has had progressively worsening depression. Patient reports a history of chronic neck and back pain exacerbating her depression. Patient admits to being suicidal, apparently told her friend she was going to kill anyone and then start her on throat. During evaluation here, patient cannot endorse specific plan but does endorse suicidality.  Past Medical History  Diagnosis Date  . Asthma   . Thyroid disease   . Bipolar affective disorder   . Anxiety    Past Surgical History  Procedure Laterality Date  . Thyroidectomy     History reviewed. No pertinent family history. History  Substance Use Topics  . Smoking status: Former Games developer  . Smokeless tobacco: Not on file  . Alcohol Use: No   OB History   Grav Para Term Preterm Abortions TAB SAB Ect Mult Living                 Review of Systems  Musculoskeletal: Positive for back pain.  Psychiatric/Behavioral: Positive for suicidal ideas and dysphoric mood.  All other systems reviewed and are negative.    Allergies  Review of patient's allergies indicates no known allergies.  Home Medications   Current Outpatient Rx  Name  Route  Sig  Dispense  Refill  . albuterol (PROVENTIL HFA;VENTOLIN HFA) 108 (90 BASE) MCG/ACT inhaler   Inhalation   Inhale 2 puffs into the lungs every 6 (six) hours as needed for shortness of breath.         . diphenhydrAMINE (SOMINEX) 25 MG tablet   Oral   Take 50 mg by mouth at bedtime.          . DULoxetine (CYMBALTA) 30 MG capsule   Oral   Take 30 mg by mouth daily.         Marland Kitchen loratadine (CLARITIN) 10 MG tablet   Oral   Take 10 mg by mouth daily.         Marland Kitchen levonorgestrel (MIRENA) 20 MCG/24HR IUD   Intrauterine   1 each by Intrauterine route once. Inserted September 2008           BP 141/94  Pulse 92  Temp(Src) 98.5 F (36.9 C) (Oral)  Resp 16  SpO2 100% Physical Exam  Constitutional: She is oriented to person, place, and time. She appears well-developed and well-nourished. No distress.  HENT:  Head: Normocephalic and atraumatic.  Right Ear: Hearing normal.  Left Ear: Hearing normal.  Nose: Nose normal.  Mouth/Throat: Oropharynx is clear and moist and mucous membranes are normal.  Eyes: Conjunctivae and EOM are normal. Pupils are equal, round, and reactive to light.  Neck: Normal range of motion. Neck supple.  Cardiovascular: Regular rhythm, S1 normal and S2 normal.  Exam reveals no gallop and no friction rub.   No murmur heard. Pulmonary/Chest: Effort normal and breath sounds normal. No respiratory distress. She exhibits no tenderness.  Abdominal: Soft. Normal appearance and bowel sounds are normal. There is no hepatosplenomegaly. There is no tenderness. There is no rebound, no guarding, no tenderness at McBurney's point and negative Murphy's sign.  No hernia.  Musculoskeletal: Normal range of motion.  Neurological: She is alert and oriented to person, place, and time. She has normal strength. No cranial nerve deficit or sensory deficit. Coordination normal. GCS eye subscore is 4. GCS verbal subscore is 5. GCS motor subscore is 6.  Skin: Skin is warm, dry and intact. No rash noted. No cyanosis.  Psychiatric: Her speech is normal and behavior is normal. She exhibits a depressed mood. She expresses suicidal ideation.    ED Course  Procedures (including critical care time) Labs Reviewed  ACETAMINOPHEN LEVEL  CBC  COMPREHENSIVE METABOLIC PANEL  ETHANOL   SALICYLATE LEVEL  URINE RAPID DRUG SCREEN (HOSP PERFORMED)   No results found.  Diagnosis: Depression  MDM   patient presents to ER for evaluation of progressively worsening depression. Patient has a previous suicide attempt and multiple hospitalizations. She has been off all medications and is currently suicidal. She will require psychiatric treatment as an inpatient. Patient medically clear for psychiatric treatment.  Patient becoming progressively more agitated threatening to leave the emergency department. IVC initiated. Patient administered Geodon for her agitation.   Gilda Crease, MD 06/22/13 1519  Gilda Crease, MD 06/22/13 [phone]

## 2013-06-23 ENCOUNTER — Encounter (HOSPITAL_COMMUNITY): Payer: Self-pay | Admitting: *Deleted

## 2013-06-23 ENCOUNTER — Inpatient Hospital Stay (HOSPITAL_COMMUNITY)
Admission: EM | Admit: 2013-06-23 | Discharge: 2013-06-30 | DRG: 885 | Disposition: A | Discharge status: Home / Self Care | Payer: Medicaid Other | Source: Intra-hospital | Attending: Psychiatry | Admitting: Psychiatry

## 2013-06-23 DIAGNOSIS — IMO0001 Reserved for inherently not codable concepts without codable children: Secondary | ICD-10-CM | POA: Diagnosis present

## 2013-06-23 DIAGNOSIS — Z9114 Patient's other noncompliance with medication regimen: Secondary | ICD-10-CM

## 2013-06-23 DIAGNOSIS — F1910 Other psychoactive substance abuse, uncomplicated: Secondary | ICD-10-CM

## 2013-06-23 DIAGNOSIS — F1210 Cannabis abuse, uncomplicated: Secondary | ICD-10-CM | POA: Diagnosis present

## 2013-06-23 DIAGNOSIS — R45851 Suicidal ideations: Secondary | ICD-10-CM

## 2013-06-23 DIAGNOSIS — G4700 Insomnia, unspecified: Secondary | ICD-10-CM | POA: Diagnosis present

## 2013-06-23 DIAGNOSIS — R413 Other amnesia: Secondary | ICD-10-CM

## 2013-06-23 DIAGNOSIS — E079 Disorder of thyroid, unspecified: Secondary | ICD-10-CM | POA: Diagnosis present

## 2013-06-23 DIAGNOSIS — M129 Arthropathy, unspecified: Secondary | ICD-10-CM | POA: Diagnosis present

## 2013-06-23 DIAGNOSIS — Z79899 Other long term (current) drug therapy: Secondary | ICD-10-CM

## 2013-06-23 DIAGNOSIS — F4310 Post-traumatic stress disorder, unspecified: Secondary | ICD-10-CM | POA: Diagnosis present

## 2013-06-23 DIAGNOSIS — Z9119 Patient's noncompliance with other medical treatment and regimen: Secondary | ICD-10-CM

## 2013-06-23 DIAGNOSIS — F411 Generalized anxiety disorder: Secondary | ICD-10-CM | POA: Diagnosis present

## 2013-06-23 DIAGNOSIS — F329 Major depressive disorder, single episode, unspecified: Secondary | ICD-10-CM

## 2013-06-23 DIAGNOSIS — F3130 Bipolar disorder, current episode depressed, mild or moderate severity, unspecified: Principal | ICD-10-CM | POA: Diagnosis present

## 2013-06-23 DIAGNOSIS — Z87891 Personal history of nicotine dependence: Secondary | ICD-10-CM

## 2013-06-23 DIAGNOSIS — Z91199 Patient's noncompliance with other medical treatment and regimen due to unspecified reason: Secondary | ICD-10-CM

## 2013-06-23 DIAGNOSIS — F332 Major depressive disorder, recurrent severe without psychotic features: Secondary | ICD-10-CM

## 2013-06-23 HISTORY — DX: Unspecified convulsions: R56.9

## 2013-06-23 HISTORY — DX: Unspecified osteoarthritis, unspecified site: M19.90

## 2013-06-23 HISTORY — DX: Major depressive disorder, single episode, unspecified: F32.9

## 2013-06-23 HISTORY — DX: Depression, unspecified: F32.A

## 2013-06-23 MED ORDER — MAGNESIUM HYDROXIDE 400 MG/5ML PO SUSP: 30.0000 mL | Freq: Every day | ORAL | Status: DC | PRN

## 2013-06-23 MED ORDER — ALBUTEROL SULFATE HFA 108 (90 BASE) MCG/ACT IN AERS: 2.0000 | INHALATION_SPRAY | Freq: Four times a day (QID) | RESPIRATORY_TRACT | Status: DC | PRN

## 2013-06-23 MED ORDER — HYDROXYZINE HCL 50 MG PO TABS
50.0000 mg | ORAL_TABLET | Freq: Every evening | ORAL | Status: DC | PRN
  Administered 2013-06-23 – 2013-06-24 (×3): 50 mg via ORAL
  Filled 2013-06-23 (×7): qty 1

## 2013-06-23 MED ORDER — DULOXETINE HCL 30 MG PO CPEP
30.0000 mg | ORAL_CAPSULE | Freq: Every day | ORAL | Status: DC
  Administered 2013-06-24 – 2013-06-30 (×7): 30 mg via ORAL
  Filled 2013-06-23 (×9): qty 1

## 2013-06-23 MED ORDER — ACETAMINOPHEN 325 MG PO TABS: 650.0000 mg | ORAL_TABLET | Freq: Four times a day (QID) | ORAL | Status: DC | PRN

## 2013-06-23 MED ORDER — HYDROXYZINE HCL 50 MG PO TABS
50.0000 mg | ORAL_TABLET | Freq: Four times a day (QID) | ORAL | Status: DC | PRN
  Administered 2013-06-23 – 2013-06-29 (×4): 50 mg via ORAL
  Filled 2013-06-23 (×3): qty 1

## 2013-06-23 MED ORDER — ALUM & MAG HYDROXIDE-SIMETH 200-200-20 MG/5ML PO SUSP
30.0000 mL | ORAL | Status: DC | PRN
  Administered 2013-06-29: 30 mL via ORAL

## 2013-06-23 MED ORDER — LORATADINE 10 MG PO TABS
10.0000 mg | ORAL_TABLET | Freq: Every day | ORAL | Status: DC
  Administered 2013-06-24 – 2013-06-30 (×7): 10 mg via ORAL
  Filled 2013-06-23 (×9): qty 1

## 2013-06-23 MED ORDER — IBUPROFEN 800 MG PO TABS
800.0000 mg | ORAL_TABLET | Freq: Three times a day (TID) | ORAL | Status: DC | PRN
  Administered 2013-06-23 – 2013-06-24 (×3): 800 mg via ORAL
  Filled 2013-06-23 (×3): qty 1

## 2013-06-23 NOTE — ED Provider Notes (Signed)
Patient accepted at [facility] under Dr. Carmelina Dane. Stable for transfer.   Richardean Canal, MD 06/23/13 [phone]

## 2013-06-23 NOTE — Consult Note (Signed)
Reason for Consult:Eval for IP psychiatric mgmt Referring Physician: [facility] Blinda Leatherwood MD)  Rebecca Solis is an 42 y.o. female.  HPI: Pt is a 42 y/o AAF with long standing hx of bipolar disorder, with previous hx of prior SA i.e. Drug O/D within the last year. Pt endorses over the past 3 days passive SI along with HI towards her Childrens fathers as they have been arguing a lot lately.. Pt denies any AVH, or paranoia  But cannot contract for safety at this time. Pt states that she been non compliant with her previously Rx psychotropics as well as non compliant with psychiatric visits with Dr Inda Coke.  Past Medical History  Diagnosis Date  . Asthma   . Thyroid disease   . Bipolar affective disorder   . Anxiety     Past Surgical History  Procedure Laterality Date  . Thyroidectomy      History reviewed. No pertinent family history.  Social History:  reports that she has quit smoking. She does not have any smokeless tobacco history on file. She reports that she does not drink alcohol or use illicit drugs.  Allergies: No Known Allergies  Medications: I have reviewed the patient's current medications.  Results for orders placed during the hospital encounter of 06/22/13 (from the past 48 hour(s))  ACETAMINOPHEN LEVEL     Status: None   Collection Time    06/22/13  2:25 PM      Result Value Range   Acetaminophen (Tylenol), Serum <15.0  10 - 30 ug/mL   Comment:            THERAPEUTIC CONCENTRATIONS VARY     SIGNIFICANTLY. A RANGE OF 10-30     ug/mL MAY BE AN EFFECTIVE     CONCENTRATION FOR MANY PATIENTS.     HOWEVER, SOME ARE BEST TREATED     AT CONCENTRATIONS OUTSIDE THIS     RANGE.     ACETAMINOPHEN CONCENTRATIONS     >150 ug/mL AT 4 HOURS AFTER     INGESTION AND >50 ug/mL AT 12     HOURS AFTER INGESTION ARE     OFTEN ASSOCIATED WITH TOXIC     REACTIONS.  CBC     Status: None   Collection Time    06/22/13  2:25 PM      Result Value Range   WBC 5.0  4.0 - 10.5 K/uL   RBC 5.05  3.87 - 5.11 MIL/uL   Hemoglobin 13.9  12.0 - 15.0 g/dL   HCT 86.5  78.4 - 69.6 %   MCV 82.4  78.0 - 100.0 fL   MCH 27.5  26.0 - 34.0 pg   MCHC 33.4  30.0 - 36.0 g/dL   RDW 29.5  28.4 - 13.2 %   Platelets 257  150 - 400 K/uL  COMPREHENSIVE METABOLIC PANEL     Status: Abnormal   Collection Time    06/22/13  2:25 PM      Result Value Range   Sodium 137  135 - 145 mEq/L   Potassium 3.7  3.5 - 5.1 mEq/L   Chloride 104  96 - 112 mEq/L   CO2 22  19 - 32 mEq/L   Glucose, Bld 102 (*) 70 - 99 mg/dL   BUN 8  6 - 23 mg/dL   Creatinine, Ser 4.40  0.50 - 1.10 mg/dL   Calcium 9.5  8.4 - 10.2 mg/dL   Total Protein 7.6  6.0 - 8.3 g/dL   Albumin 3.7  3.5 - 5.2 g/dL   AST 12  0 - 37 U/L   ALT 8  0 - 35 U/L   Alkaline Phosphatase 64  39 - 117 U/L   Total Bilirubin 0.3  0.3 - 1.2 mg/dL   GFR calc non Af Amer >90  >90 mL/min   GFR calc Af Amer >90  >90 mL/min   Comment:            The eGFR has been calculated     using the CKD EPI equation.     This calculation has not been     validated in all clinical     situations.     eGFR's persistently     <90 mL/min signify     possible Chronic Kidney Disease.  ETHANOL     Status: None   Collection Time    06/22/13  2:25 PM      Result Value Range   Alcohol, Ethyl (B) <11  0 - 11 mg/dL   Comment:            LOWEST DETECTABLE LIMIT FOR     SERUM ALCOHOL IS 11 mg/dL     FOR MEDICAL PURPOSES ONLY  SALICYLATE LEVEL     Status: Abnormal   Collection Time    06/22/13  2:25 PM      Result Value Range   Salicylate Lvl <2.0 (*) 2.8 - 20.0 mg/dL  POCT PREGNANCY, URINE     Status: None   Collection Time    06/22/13  2:29 PM      Result Value Range   Preg Test, Ur NEGATIVE  NEGATIVE   Comment:            THE SENSITIVITY OF THIS     METHODOLOGY IS >24 mIU/mL  URINE RAPID DRUG SCREEN (HOSP PERFORMED)     Status: Abnormal   Collection Time    06/22/13  2:31 PM      Result Value Range   Opiates NONE DETECTED  NONE DETECTED   Cocaine NONE  DETECTED  NONE DETECTED   Benzodiazepines NONE DETECTED  NONE DETECTED   Amphetamines NONE DETECTED  NONE DETECTED   Tetrahydrocannabinol POSITIVE (*) NONE DETECTED   Barbiturates NONE DETECTED  NONE DETECTED   Comment:            DRUG SCREEN FOR MEDICAL PURPOSES     ONLY.  IF CONFIRMATION IS NEEDED     FOR ANY PURPOSE, NOTIFY LAB     WITHIN 5 DAYS.                LOWEST DETECTABLE LIMITS     FOR URINE DRUG SCREEN     Drug Class       Cutoff (ng/mL)     Amphetamine      1000     Barbiturate      200     Benzodiazepine   200     Tricyclics       300     Opiates          300     Cocaine          300     THC              50    No results found.  Review of Systems  Psychiatric/Behavioral: Positive for depression, suicidal ideas, memory loss and substance abuse. Negative for hallucinations. The patient is not nervous/anxious and does not have insomnia.  Patient states she been having passive SI thoughts along with HI towards one of her fathers children over the past 2-3 days. Denies AVH, paranoia but cannot contract for safety at present.  All other systems reviewed and are negative.   Blood pressure 105/74, pulse 74, temperature 98.2 F (36.8 C), temperature source Oral, resp. rate 18, SpO2 97.00%. Physical Exam  Nursing note and vitals reviewed. Constitutional: She is oriented to person, place, and time. She appears well-developed and well-nourished.  HENT:  Head: Normocephalic.  Eyes: Pupils are equal, round, and reactive to light.  Neck: Normal range of motion. Neck supple. No thyromegaly present.  Cardiovascular: Normal rate.   Respiratory: Effort normal and breath sounds normal.  GI: Soft. Bowel sounds are normal.  Lymphadenopathy:    She has no cervical adenopathy.  Neurological: She is alert and oriented to person, place, and time. No cranial nerve deficit.  Skin: Skin is warm and dry.  Psychiatric:  somnolent arousable and coherent. With logical thought  processes and fair over all insight.    Assessment/Plan: 1) Admit to [facility] 500 hall for crises mangement, safety and stability 2) Mgmt of applicable co -morbid conditions 3) Intensive IP psychotherapy and intitiate psychotropic medications 4) Social work to aid in [employer] and facilitate support services to prevent recurrent admissions.  SIMON,SPENCER E 06/23/2013, 12:13 AM

## 2013-06-23 NOTE — BHH Counselor (Signed)
Pt accepted by Donell Sievert, PA to Dr. Henrene Hawking. The room assignment is  505-2. Support paperwork completed.
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
## 2013-06-23 NOTE — Progress Notes (Signed)
Patient ID: Rebecca Solis, female   DOB: 1970/12/26, 42 y.o.   MRN: [id number] D:  Patient admitted from [facility].  She was admitted for depression and stabilization of her bipolar symptoms.  States she had been off medications for quite a while and things were going well until recently.  States she became overwhelmed and was feeling paranoid, hopeless, and having thoughts of ending her life.  She did not have a specific plan or intent, but thoughts that the world would be better off without her.  She was cooperative with the admission process.  She did request something for anxiety and requested ibuprofen for chronic back pain.  She was oriented to the unit and to the unit schedules.  Dinner was obtained for her as well as a drink.  Patient is currently on her menstrual cycle and was given some pads per her request.  She states she is not currently in danger of hurting herself and states she just wants to get some help and feel better.  A:  Admission process completed.  Patient oriented to the unit and to her surroundings.  Unit rules were explained and all paperwork signed.   R:  Pleasant and cooperative with the admission process.  Patient is a good historian and states she really wants to feel better about herself and her life.  States she is anxious to get started back on medication for her bipolar treatment.

## 2013-06-23 NOTE — Tx Team (Signed)
Initial Interdisciplinary Treatment Plan  PATIENT STRENGTHS: (choose at least two) Ability for insight Active sense of humor Average or above average intelligence Capable of independent living Communication skills Financial means General fund of knowledge Motivation for treatment/growth Religious Affiliation Special hobby/interest Supportive family/friends Work skills  PATIENT STRESSORS: Medication change or noncompliance   PROBLEM LIST: Problem List/Patient Goals Date to be addressed Date deferred Reason deferred Estimated date of resolution  Stabilization of mood and adjustment of medications 23 Jun 2013     Decrease anxiety and learn how to cope with it better 23 Jun 2013     Learn how to better manage depressive symptoms 23 Jun 2013                                          DISCHARGE CRITERIA:  Ability to meet basic life and health needs Adequate post-discharge living arrangements Improved stabilization in mood, thinking, and/or behavior Motivation to continue treatment in a less acute level of care Need for constant or close observation no longer present  PRELIMINARY DISCHARGE PLAN: Outpatient therapy  PATIENT/FAMIILY INVOLVEMENT: This treatment plan has been presented to and reviewed with the patient, Rebecca Solis, and/or family member.  The patient and family have been given the opportunity to ask questions and make suggestions.  Rebecca Solis 06/23/2013, 6:17 PM

## 2013-06-24 DIAGNOSIS — F1210 Cannabis abuse, uncomplicated: Secondary | ICD-10-CM

## 2013-06-24 DIAGNOSIS — Z9119 Patient's noncompliance with other medical treatment and regimen: Secondary | ICD-10-CM

## 2013-06-24 DIAGNOSIS — F3130 Bipolar disorder, current episode depressed, mild or moderate severity, unspecified: Principal | ICD-10-CM

## 2013-06-24 NOTE — Progress Notes (Signed)
Adult Psychoeducational Group Note  Date:  06/24/2013 Time:  1:59 PM  Group Topic/Focus:  Personal Choices and Values:   The focus of this group is to help patients assess and explore the importance of values in their lives, how their values affect their decisions, how they express their values and what opposes their expression.  Participation Level:  Active  Participation Quality:  Engaged, supportive  Affect:  Appropriate  Cognitive:  Alert and Appropriate  Insight: Appropriate and Good  Engagement in Group:  Engaged  Modes of Intervention:  Discussion and Exploration  Additional Comments:  Pt wants to stay on recovery and decrease her levels of depression and anxiety. She also wants to talk to her doctor and take control over her life.   [place] Shi K 06/24/2013, 1:59 PM

## 2013-06-24 NOTE — Progress Notes (Signed)
Pt has been observed this evening playing cards in the dayroom with her peers and interacting appropriately on the unit.  Pt appears a little brighter this evening.  She reports she has been attending groups. She is hoping by getting back on her meds that her mood and outlook with improve.  Pt makes her needs known to staff.  Pt voices no needs/concerns at this time.  Support and encouragement offered.  Safety maintained with q15 minute checks.

## 2013-06-24 NOTE — Progress Notes (Signed)
D:Pt has a flat/anxious affect. She rates her depression as a 7 on 1-10 scale with 10 being the most depressed. Pt c/o back pain related to an old injury and arthritis. Pt talked about getting upset with her 41 yr old daughter for posting information about her mother on facebook when she does not want her daughter to use social media. She said that she yelled and said bad things to her daughter and now feels guilty. She is working on Chiropractor the relationship.  A:Supported pt to discuss feelings. Offered encouragement and 15 minute checks. Discussed benefits of talking with an OP counselor related to family counseling. R:Pt denies si and hi. She is going to groups and interacting on the unit. Pt signed the voluntary admission papers. Safety maintained on the unit.

## 2013-06-24 NOTE — Tx Team (Signed)
Interdisciplinary Treatment Plan Update   Date Reviewed:  06/24/2013  Time Reviewed:  9:56 AM  Progress in Treatment:   Attending groups: Yes Participating in groups: Yes Taking medication as prescribed: Yes  Tolerating medication: Yes Family/Significant other contact made: No, but will ask patient for consent for collateral contact Patient understands diagnosis: Yes  Discussing patient identified problems/goals with staff: Yes Medical problems stabilized or resolved: Yes Denies suicidal/homicidal ideation: Yes Patient has not harmed self or others: Yes  For review of initial/current patient goals, please see plan of care.  Estimated Length of Stay:  3-4 days  Reasons for Continued Hospitalization:  Anxiety Depression Medication stabilization Suicidal ideation  New Problems/Goals identified:    Discharge Plan or Barriers:   Home with outpatient follow up  Additional Comments: N/A  Attendees:  Patient:  06/24/2013 9:56 AM   Signature: Mervyn Gay, MD 06/24/2013 9:56 AM  Signature: 06/24/2013 9:56 AM  Signature: Harold Barban, RN 06/24/2013 9:56 AM  Signature  Leighton Parody, RN 06/24/2013 9:56 AM  Signature:   06/24/2013 9:56 AM  Signature:  Juline Patch, LCSW 06/24/2013 9:56 AM  Signature:  Reyes Ivan, LCSW 06/24/2013 9:56 AM  Signature:  Sharin Grave Coordinator 06/24/2013 9:56 AM  Signature: Fransisca Kaufmann, [facility] 06/24/2013 9:56 AM  Signature:    Signature:    Signature:      Scribe for Treatment Team:   Juline Patch,  06/24/2013 9:56 AM

## 2013-06-24 NOTE — Progress Notes (Signed)
Adult Psychoeducational Group Note  Date:  06/24/2013 Time:  8:00PM  Group Topic/Focus:  Wrap-Up Group:   The focus of this group is to help patients review their daily goal of treatment and discuss progress on daily workbooks.  Participation Level:  Active  Participation Quality:  Appropriate and Attentive  Affect:  Appropriate  Cognitive:  Alert and Appropriate  Insight: Appropriate  Engagement in Group:  Engaged  Modes of Intervention:  Discussion  Additional Comments:  Pt. Was attentive and appropriate during tonight's group discussion. Pt. Stated that she had a good day and was able to lean a lot during group with Child psychotherapist.   Bing Plume D 06/24/2013, 9:20 PM
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
## 2013-06-24 NOTE — [facility] Group Notes (Signed)
[facility] LCSW Aftercare Discharge Planning Group Note   06/24/2013 10:51 AM  Participation Quality:  Appropriate  Mood/Affect:  Appropriate and Depressed  Depression Rating:  6 - 7  Anxiety Rating:  8  Thoughts of Suicide:  No  Will you contract for safety?   NA  Current AVH:  No  Plan for Discharge/Comments:  Patient advised of admitting to hospital due to increased depression and having been off medications for months.  She reports having home and access to medications.  Patient advised of being followed by Dr. Inda Coke at [facility].  Transportation Means: Patient has transportation.   Supports:  Patient has a good support system.   Rebecca Solis, Joesph July

## 2013-06-24 NOTE — Progress Notes (Signed)
Pt reports she is feeling anxious about being here.  She was just admitted this evening shortly before shift change.  Spoke with the PA about ordering a med for anxiety and something for pain.  Ibuprofen 800mg  and Vistaril 50 mg q6h prn was ordered per pt's request.  Pt has been observed talking with peers and has adjusted well as observed by staff.  Pt denies SI/HI/AV at this time.  Pt wants to get back on her medications.  Pt is pleasant/cooperative with staff.  Pt voices no other needs at this time.  Pt encouraged to make her needs known to staff.  Safety maintained with q15 minute checks.
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
## 2013-06-25 ENCOUNTER — Encounter (HOSPITAL_COMMUNITY): Payer: Self-pay | Admitting: *Deleted

## 2013-06-25 MED ORDER — ZIPRASIDONE HCL 20 MG PO CAPS
20.0000 mg | ORAL_CAPSULE | Freq: Two times a day (BID) | ORAL | Status: DC
  Administered 2013-06-25 – 2013-06-30 (×10): 20 mg via ORAL
  Filled 2013-06-25 (×12): qty 1

## 2013-06-25 MED ORDER — NAPROXEN 500 MG PO TABS
500.0000 mg | ORAL_TABLET | Freq: Once | ORAL | Status: DC
  Filled 2013-06-25: qty 1

## 2013-06-25 MED ORDER — NAPROXEN 500 MG PO TABS
500.0000 mg | ORAL_TABLET | Freq: Two times a day (BID) | ORAL | Status: DC
  Administered 2013-06-25 – 2013-06-30 (×11): 500 mg via ORAL
  Filled 2013-06-25 (×12): qty 1

## 2013-06-25 MED ORDER — HYDROXYZINE HCL 50 MG PO TABS
50.0000 mg | ORAL_TABLET | Freq: Every evening | ORAL | Status: DC | PRN
  Administered 2013-06-25 – 2013-06-29 (×5): 50 mg via ORAL
  Filled 2013-06-25: qty 2

## 2013-06-25 MED ORDER — CLONAZEPAM 0.5 MG PO TABS
0.5000 mg | ORAL_TABLET | Freq: Two times a day (BID) | ORAL | Status: DC
  Administered 2013-06-25: 0.5 mg via ORAL
  Filled 2013-06-25 (×3): qty 1

## 2013-06-25 NOTE — [facility] Suicide Risk Assessment (Signed)
[facility] INPATIENT:  Family/Significant Other Suicide Prevention Education  Suicide Prevention Education:  Contact Attempted, Maryclare Bean, Mother, [phone], has been identified by the patient as the family member/significant other with whom the patient will be residing, and identified as the person(s) who will aid the patient in the event of a mental health crisis.  With written consent from the patient, two attempts were made to provide suicide prevention education, prior to and/or following the patient's discharge.  We were unsuccessful in providing suicide prevention education.  A suicide education pamphlet was given to the patient to share with family/significant other.  Date and time of first attempt: 06/25/13 @ 3:16 PM   Sharryn Belding, Joesph July 06/25/2013, 3:13 PM

## 2013-06-25 NOTE — [facility] Group Notes (Signed)
[facility] LCSW Group Therapy  [facility] 1:15 - 2:30   06/25/2013 3:10 PM  Type of Therapy:  Group Therapy  Participation Level:  Minimal  Participation Quality:  Appropriate  Affect:  Appropriate and Depressed  Cognitive:  Appropriate  Insight:  Developing/Improving  Engagement in Therapy:  Developing/Improving  Modes of Intervention:  Discussion, Education, Exploration, Problem-Solving, Rapport Building, Support  Summary of Progress/Problems: Patient left group before session ended.  Wynn Banker 06/25/2013, 3:10 PM
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
## 2013-06-25 NOTE — Progress Notes (Signed)
D:Pt rates depression as a 5 on 1-10 scale with 10 being the most depressed. She reports not sleeping well last night and her affect is flat. She is working on blocking out negative thoughts and replacing them with positive thoughts.   A:Offered support encouragement and 15 minute checks. R:Pt denies si and hi. Safety maintained on the unit.

## 2013-06-25 NOTE — Progress Notes (Addendum)
(  D) Patient calm and cooperative through out evening until bedtime med pass. Patient stated that she wants Klonopin at bedtime. Behavior irritable and loud.Patient had dose at 1616. Patient refused PRN Vistaril. Patient encouraged to speak with provider in AM.  2049 (D) Charge nurse consulted. Second daily dose of Klonopin administered with PRN Vistrail 50 mg @ 2035.

## 2013-06-25 NOTE — Progress Notes (Signed)
[facility] MD Progress Note  06/25/2013 12:57 PM Rebecca Solis  MRN:  [id number] Subjective:  Patient complaint of feeling depressed, anxious, suicidal and insomnia and her medication did not help at all. She is seeking medication changes for better control of her moods and insomnia and joint pains.   Diagnosis:  Axis I: Bipolar, Depressed  ADL's:  Intact  Sleep: Fair  Appetite:  Fair  Suicidal Ideation:  Endorses suicidal ideation and plans but no intention Homicidal Ideation:  None  AEB (as evidenced by):  Psychiatric Specialty Exam: ROS  Blood pressure 136/92, pulse 104, temperature 97.7 F (36.5 C), temperature source Oral, resp. rate 18, height 5\' 7"  (1.702 m), weight 91.173 kg (201 lb), last menstrual period 06/23/2013, SpO2 99.00%.Body mass index is 31.47 kg/(m^2).  General Appearance: Disheveled and Guarded  Eye Contact::  Minimal  Speech:  Clear and Coherent  Volume:  Decreased  Mood:  Angry, Anxious, Depressed, Hopeless, Irritable and Worthless  Affect:  Congruent, Constricted, Depressed and Flat  Thought Process:  Coherent and Goal Directed  Orientation:  Full (Time, Place, and Person)  Thought Content:  Paranoid Ideation and Rumination  Suicidal Thoughts:  Yes.  without intent/plan  Homicidal Thoughts:  No  Memory:  Immediate;   Fair  Judgement:  Fair  Insight:  Lacking  Psychomotor Activity:  Psychomotor Retardation and Restlessness  Concentration:  Fair  Recall:  Fair  Akathisia:  Negative  Handed:  Right  AIMS (if indicated):     Assets:  Communication Skills Desire for Improvement Physical Health Social Support  Sleep:  Number of Hours: 2.75   Current Medications: Current Facility-Administered Medications  Medication Dose Route Frequency Provider Last Rate Last Dose  . acetaminophen (TYLENOL) tablet 650 mg  650 mg Oral Q6H PRN Kerry Hough, PA-C      . albuterol (PROVENTIL HFA;VENTOLIN HFA) 108 (90 BASE) MCG/ACT inhaler 2 puff  2 puff Inhalation  Q6H PRN Kerry Hough, PA-C      . alum & mag hydroxide-simeth (MAALOX/MYLANTA) 200-200-20 MG/5ML suspension 30 mL  30 mL Oral Q4H PRN Kerry Hough, PA-C      . clonazePAM (KLONOPIN) tablet 0.5 mg  0.5 mg Oral BID Nehemiah Settle, MD      . DULoxetine (CYMBALTA) DR capsule 30 mg  30 mg Oral Daily Kerry Hough, PA-C   30 mg at 06/25/13 0800  . hydrOXYzine (ATARAX/VISTARIL) tablet 50 mg  50 mg Oral Q6H PRN Kerry Hough, PA-C   50 mg at 06/23/13 2007  . hydrOXYzine (ATARAX/VISTARIL) tablet 50 mg  50 mg Oral QHS PRN Nehemiah Settle, MD      . loratadine (CLARITIN) tablet 10 mg  10 mg Oral Daily Kerry Hough, PA-C   10 mg at 06/25/13 0800  . magnesium hydroxide (MILK OF MAGNESIA) suspension 30 mL  30 mL Oral Daily PRN Kerry Hough, PA-C      . naproxen (NAPROSYN) tablet 500 mg  500 mg Oral BID WC Nehemiah Settle, MD      . naproxen (NAPROSYN) tablet 500 mg  500 mg Oral Once Nehemiah Settle, MD      . ziprasidone (GEODON) capsule 20 mg  20 mg Oral BID WC Nehemiah Settle, MD        Lab Results: No results found for this or any previous visit (from the past 48 hour(s)).  Physical Findings: AIMS: Facial and Oral Movements Muscles of Facial Expression: None, normal Lips and Perioral Area:  None, normal Jaw: None, normal Tongue: None, normal,Extremity Movements Upper (arms, wrists, hands, fingers): None, normal Lower (legs, knees, ankles, toes): None, normal, Trunk Movements Neck, shoulders, hips: None, normal, Overall Severity Severity of abnormal movements (highest score from questions above): None, normal Incapacitation due to abnormal movements: None, normal Patient's awareness of abnormal movements (rate only patient's report): No Awareness, Dental Status Current problems with teeth and/or dentures?: No Does patient usually wear dentures?: No  CIWA:    COWS:     Treatment Plan Summary: Daily contact with patient to assess  and evaluate symptoms and progress in treatment Medication management  Plan:Treatment Plan/Recommendations:   1. Start Geodon 20 mg bid, klonopin 0.5 mg BID and naprosyn 500 mg bid 2. Medication management to reduce current symptoms to base line and improve the patient's overall level of functioning. 3. Treat health problems as indicated. 4. Develop treatment plan to decrease risk of relapse upon discharge and to reduce the need for readmission. 5. Psycho-social education regarding relapse prevention and self care. 6. Health care follow up as needed for medical problems. 7. Restart home medications where appropriate.   Medical Decision Making Problem Points:  Established problem, worsening (2), Review of last therapy session (1) and Review of psycho-social stressors (1) Data Points:  Decision to obtain old records (1) Review or order clinical lab tests (1) Review of medication regiment & side effects (2) Review of new medications or change in dosage (2)  I certify that inpatient services furnished can reasonably be expected to improve the patient's condition.   Rebecca Solis,JANARDHAHA R. 06/25/2013, 12:57 PM

## 2013-06-26 MED ORDER — BENZTROPINE MESYLATE 1 MG PO TABS
1.0000 mg | ORAL_TABLET | Freq: Once | ORAL | Status: AC
  Administered 2013-06-26: 1 mg via ORAL
  Filled 2013-06-26 (×2): qty 1

## 2013-06-26 MED ORDER — BENZTROPINE MESYLATE 1 MG PO TABS
1.0000 mg | ORAL_TABLET | Freq: Two times a day (BID) | ORAL | Status: DC
  Administered 2013-06-26 – 2013-06-30 (×8): 1 mg via ORAL
  Filled 2013-06-26 (×10): qty 1

## 2013-06-26 MED ORDER — CLONAZEPAM 0.5 MG PO TABS
0.5000 mg | ORAL_TABLET | Freq: Two times a day (BID) | ORAL | Status: DC
  Administered 2013-06-26 – 2013-06-30 (×8): 0.5 mg via ORAL
  Filled 2013-06-26 (×8): qty 1

## 2013-06-26 NOTE — Progress Notes (Addendum)
D: Pt in bed resting with eyes closed. Respirations even and unlabored. Pt appears to be in no signs of distress at this time. A: Q15min checks remains for this pt. R: Pt remains safe at this time.   

## 2013-06-26 NOTE — Progress Notes (Signed)
Patient ID: Rebecca Solis, female   DOB: 02-10-1971, 41 y.o.   MRN: [id number] [facility] MD Progress Note  06/26/2013 2:45 PM [name]  MRN:  [id number] Subjective:   Patient reports that her mood is stabilizing on her current regimen. She is very concerned that her jaw is feeling tight and that her legs will not stop shaking. Patient reports that this has never happened to her stating "This is different from anxiety. Just not the same." She reports sleeping and eating better.   Diagnosis:  Axis I: Bipolar, Depressed  ADL's:  Intact  Sleep: Fair  Appetite:  Fair  Suicidal Ideation:  Endorses suicidal ideation and plans but no intention Homicidal Ideation:  None  AEB (as evidenced by):  Psychiatric Specialty Exam: Review of Systems  Constitutional: Negative.   HENT: Negative.   Eyes: Negative.   Respiratory: Negative.   Cardiovascular: Negative.   Gastrointestinal: Negative.   Genitourinary: Negative.   Musculoskeletal: Negative.   Skin: Negative.   Neurological: Negative.   Endo/Heme/Allergies: Negative.   Psychiatric/Behavioral: Positive for depression and suicidal ideas. Negative for hallucinations, memory loss and substance abuse. The patient is nervous/anxious. The patient does not have insomnia.     Blood pressure 125/88, pulse 78, temperature 97.9 F (36.6 C), temperature source Oral, resp. rate 16, height 5\' 7"  (1.702 m), weight 91.173 kg (201 lb), last menstrual period 06/23/2013, SpO2 99.00%.Body mass index is 31.47 kg/(m^2).  General Appearance: Disheveled and Guarded  Eye Contact::  Minimal  Speech:  Clear and Coherent  Volume:  Decreased  Mood:  Angry, Anxious, Depressed, Hopeless, Irritable and Worthless  Affect:  Congruent, Constricted, Depressed and Flat  Thought Process:  Coherent and Goal Directed  Orientation:  Full (Time, Place, and Person)  Thought Content:  Paranoid Ideation and Rumination  Suicidal Thoughts:  Yes.  without intent/plan   Homicidal Thoughts:  No  Memory:  Immediate;   Fair  Judgement:  Fair  Insight:  Lacking  Psychomotor Activity:  Psychomotor Retardation and Restlessness  Concentration:  Fair  Recall:  Fair  Akathisia:  Yes  Handed:  Right  AIMS (if indicated):     Assets:  Communication Skills Desire for Improvement Physical Health Social Support  Sleep:  Number of Hours: 5.25   Current Medications: Current Facility-Administered Medications  Medication Dose Route Frequency Provider Last Rate Last Dose  . acetaminophen (TYLENOL) tablet 650 mg  650 mg Oral Q6H PRN Kerry Hough, PA-C      . albuterol (PROVENTIL HFA;VENTOLIN HFA) 108 (90 BASE) MCG/ACT inhaler 2 puff  2 puff Inhalation Q6H PRN Kerry Hough, PA-C      . alum & mag hydroxide-simeth (MAALOX/MYLANTA) 200-200-20 MG/5ML suspension 30 mL  30 mL Oral Q4H PRN Kerry Hough, PA-C      . benztropine (COGENTIN) tablet 1 mg  1 mg Oral BID Fransisca Kaufmann, NP      . clonazePAM Scarlette Calico) tablet 0.5 mg  0.5 mg Oral BID AC & HS Fransisca Kaufmann, NP      . DULoxetine (CYMBALTA) DR capsule 30 mg  30 mg Oral Daily Kerry Hough, PA-C   30 mg at 06/26/13 0804  . hydrOXYzine (ATARAX/VISTARIL) tablet 50 mg  50 mg Oral Q6H PRN Kerry Hough, PA-C   50 mg at 06/26/13 0806  . hydrOXYzine (ATARAX/VISTARIL) tablet 50 mg  50 mg Oral QHS PRN Nehemiah Settle, MD   50 mg at 06/25/13 2236  . loratadine (CLARITIN) tablet 10 mg  10 mg  Oral Daily Kerry Hough, PA-C   10 mg at 06/26/13 1478  . magnesium hydroxide (MILK OF MAGNESIA) suspension 30 mL  30 mL Oral Daily PRN Kerry Hough, PA-C      . naproxen (NAPROSYN) tablet 500 mg  500 mg Oral BID WC Nehemiah Settle, MD   500 mg at 06/26/13 0804  . naproxen (NAPROSYN) tablet 500 mg  500 mg Oral Once Nehemiah Settle, MD      . ziprasidone (GEODON) capsule 20 mg  20 mg Oral BID WC Nehemiah Settle, MD   20 mg at 06/26/13 2956    Lab Results: No results found for this or any  previous visit (from the past 48 hour(s)).  Physical Findings: AIMS: Facial and Oral Movements Muscles of Facial Expression: None, normal Lips and Perioral Area: None, normal Jaw: None, normal Tongue: None, normal,Extremity Movements Upper (arms, wrists, hands, fingers): None, normal Lower (legs, knees, ankles, toes): None, normal, Trunk Movements Neck, shoulders, hips: None, normal, Overall Severity Severity of abnormal movements (highest score from questions above): None, normal Incapacitation due to abnormal movements: None, normal Patient's awareness of abnormal movements (rate only patient's report): No Awareness, Dental Status Current problems with teeth and/or dentures?: No Does patient usually wear dentures?: No  CIWA:    COWS:     Treatment Plan Summary: Daily contact with patient to assess and evaluate symptoms and progress in treatment Medication management  Plan: Continue crisis management and stabilization.  Medication management: Continue Geodon 20 mg BID, Cymbalta 30 mg daily Encouraged patient to attend groups and participate in group counseling sessions and activities. Begin Cogentin 1 mg BID to address symptoms of akathesia/EPS.  Discharge plan in progress.  Address health issues: Vitals reviewed and stable.  Continue current treatment plan.    Medical Decision Making Problem Points:  Established problem, worsening (2), Review of last therapy session (1) and Review of psycho-social stressors (1) Data Points:  Decision to obtain old records (1) Review or order clinical lab tests (1) Review of medication regiment & side effects (2) Review of new medications or change in dosage (2)  I certify that inpatient services furnished can reasonably be expected to improve the patient's condition.   Rie Mcneil NP-C 06/26/2013, 2:45 PM

## 2013-06-26 NOTE — Progress Notes (Signed)
D: Patient denies SI/HI and A/V hallucinations; patient reports sleep is well; reports appetite is good ; reports energy level is normal ; reports ability to pay attention is good; rates depression as 2/10; rates hopelessness 0/10; patient reports that she does not want to take the klonopin because it makes her sleepy during the day and has some complaints of anxiety  A: Monitored q 15 minutes; patient encouraged to attend groups; patient educated about medications; patient given medications per physician orders; patient encouraged to express feelings and/or concerns  R: Patient is cooperative; patient is receptive; ; patient's interaction with staff and peers is appropriate;  patient is taking medications as prescribed and tolerating medications; patient is attending all groups is engaging

## 2013-06-27 NOTE — Progress Notes (Signed)
D) Pt has been attending the groups and interacting with her peers. Is active in the dayroom with her peers and interacts appropriately. Denies SI and HI. Rates her depression and hopelessness both at a 0. States that she is feeling much better overall. No seizure activity noted this shift. A) Pt given support, reassurance and praise.  R) Denies SI and HI.

## 2013-06-27 NOTE — Progress Notes (Signed)
Patient ID: Rebecca Solis, female   DOB: Aug 01, 1971, 42 y.o.   MRN: [id number] D)  Has been out on the hall and in the dayroom this evening, attended group, interacting well with staff and select peers, was laughing and smiling appropriately, sense of humor returning.  Stated is feeling better, came to med window for hs meds, pleasant.  Denies thoughts of self harm, able to contract for safety. A)  Will continue to monitor for safety, continue POC R)  Safety maintained

## 2013-06-27 NOTE — Progress Notes (Signed)
[facility] Group Notes:  (Nursing/MHT/Case Management/Adjunct)  Date:  06/26/2013 Time:  2000  Type of Therapy:  Psychoeducational Skills  Participation Level:  Active  Participation Quality:  Appropriate  Affect:  Appropriate  Cognitive:  Appropriate  Insight:  Limited  Engagement in Group:  Limited  Modes of Intervention:  Education  Summary of Progress/Problems: The patient verbalized that she had a good day. She stated that she was feeling better without elaborating. Her goal for tomorrow is to get discharged from the hospital.  Rebecca Solis 06/27/2013, 12:38 AM

## 2013-06-27 NOTE — Progress Notes (Signed)
Goals Group Note  Date:  06/27/2013 Time:  0930  Group Topic/Focus:  Identifying  Goals : This group focuses on helping patients identify goals they want to work towards and identify strategies needed to achieve these goals.   Participation Level:  active Participation Quality: good Affect: flat Cognitive:  good  Insight:  good  Engagement in Group: engaged  Additional Comments:    PD RN [facility]

## 2013-06-27 NOTE — Progress Notes (Signed)
Patient ID: Rebecca Solis, female   DOB: 1971/09/03, 42 y.o.   MRN: [id number] D)  Has been out and about this evening, pleasant, smiling, interacting appropriately with staff and select peers, although somewhat guarded, makes good eye contact.  Attended group and participated, states she enjoyed the groups during the day as well, feels she is improving.  Denies thoughts of self harm. A)  Will continue to monitor for safety, continue POC R),  Safety maintained at this time.

## 2013-06-27 NOTE — Progress Notes (Signed)
Psychoeducational Group Note  Date: 06/27/2013 Time:  1015  Group Topic/Focus:  Identifying Needs:   The focus of this group is to help patients identify their personal needs that have been historically problematic and identify healthy behaviors to address their needs.  Participation Level:  Active  Participation Quality:  Appropriate  Affect:  Appropriate  Cognitive:  Oriented  Insight: Improving  Engagement in Group:  Engaged  Additional Comments:  Attended the group and was involved throughout the entire group  [name] A 

## 2013-06-27 NOTE — [facility] Group Notes (Signed)
[facility] Group Notes:  (Clinical Social Work)  06/27/2013   3:00-4:00PM  Summary of Progress/Problems:   The main focus of today's process group was for the patient to identify something in their life that led to their hospitalization that they would like to change, then to discuss their motivation to change.  The Stages of Change were explained to the group, then each patient identified where they are in that process.  A scaling question was used with motivation interviewing to determine the patient's current motivation to change the identified behavior (1-10, low to high). The patient expressed at length how she ruminates on things and that causes a downward spiral.  She was one of the main discussion leaders in the group and she gave and received good feedback.  Type of Therapy:  Process Group  Participation Level:  Active  Participation Quality:  Attentive, Sharing and Supportive  Affect:  Blunted and Depressed  Cognitive:  Oriented  Insight:  Engaged  Engagement in Therapy:  Engaged  Modes of Intervention:  Education, Motivational Interviewing   Ambrose Mantle, LCSW 06/27/2013, 4:50 PM

## 2013-06-27 NOTE — Progress Notes (Signed)
[facility] MD Progress Note  06/27/2013 1:38 PM Rebecca Solis  MRN:  [id number] Subjective:  [name] is curious about her discharge date. She states that she is not in a hurry to go, but her family is wondering when to expect her home. She reports that she is here to get help. She feels that she is making improvement, and rates both her anxiety and depression as a 1 on a scale of 1-10 where 10 is the worst. She reports that her racing thoughts have significantly diminished. She denies any suicidal or homicidal ideation. She endorses good sleep and appetite.  Diagnosis:   Axis I: Bipolar, Depressed Axis II: Deferred Axis III:  Past Medical History  Diagnosis Date  . Asthma   . Thyroid disease   . Anxiety   . Bipolar affective disorder   . Depression   . Arthritis   . Seizures     None for over a year.  Do not use Abilify    ADL's:  Intact  Sleep: Good  Appetite:  Good  Suicidal Ideation:  Patient denies any thought, plan, or intent Homicidal Ideation:  Patient denies any thought, plan, or intent AEB (as evidenced by):  Psychiatric Specialty Exam: Review of Systems  Constitutional: Negative.   HENT: Negative.   Eyes: Negative.   Respiratory: Negative.   Cardiovascular: Negative.   Gastrointestinal: Negative.   Genitourinary: Negative.   Musculoskeletal: Negative.   Skin: Negative.   Neurological: Negative.   Endo/Heme/Allergies: Negative.   Psychiatric/Behavioral: Negative for depression, suicidal ideas, hallucinations and substance abuse. The patient is not nervous/anxious and does not have insomnia.     Blood pressure 115/84, pulse 6, temperature 97.9 F (36.6 C), temperature source Oral, resp. rate 16, height 5\' 7"  (1.702 m), weight 91.173 kg (201 lb), last menstrual period 06/23/2013, SpO2 99.00%.Body mass index is 31.47 kg/(m^2).  General Appearance: Casual  Eye Contact::  Good  Speech:  Clear and Coherent  Volume:  Normal  Mood:  Dysphoric  Affect:  Congruent   Thought Process:  Linear  Orientation:  Full (Time, Place, and Person)  Thought Content:  WDL  Suicidal Thoughts:  No  Homicidal Thoughts:  No  Memory:  Immediate;   Good Recent;   Good Remote;   Good  Judgement:  Fair  Insight:  Fair  Psychomotor Activity:  Normal  Concentration:  Good  Recall:  Good  Akathisia:  No  Handed:  Right  AIMS (if indicated):     Assets:  Communication Skills Desire for Improvement Social Support  Sleep:  Number of Hours: 6   Current Medications: Current Facility-Administered Medications  Medication Dose Route Frequency Provider Last Rate Last Dose  . acetaminophen (TYLENOL) tablet 650 mg  650 mg Oral Q6H PRN Kerry Hough, PA-C      . albuterol (PROVENTIL HFA;VENTOLIN HFA) 108 (90 BASE) MCG/ACT inhaler 2 puff  2 puff Inhalation Q6H PRN Kerry Hough, PA-C      . alum & mag hydroxide-simeth (MAALOX/MYLANTA) 200-200-20 MG/5ML suspension 30 mL  30 mL Oral Q4H PRN Kerry Hough, PA-C      . benztropine (COGENTIN) tablet 1 mg  1 mg Oral BID Fransisca Kaufmann, NP   1 mg at 06/27/13 4098  . clonazePAM (KLONOPIN) tablet 0.5 mg  0.5 mg Oral BID AC & HS Fransisca Kaufmann, NP   0.5 mg at 06/27/13 1191  . DULoxetine (CYMBALTA) DR capsule 30 mg  30 mg Oral Daily Kerry Hough, PA-C   30 mg  at 06/27/13 1610  . hydrOXYzine (ATARAX/VISTARIL) tablet 50 mg  50 mg Oral Q6H PRN Kerry Hough, PA-C   50 mg at 06/26/13 0806  . hydrOXYzine (ATARAX/VISTARIL) tablet 50 mg  50 mg Oral QHS PRN Nehemiah Settle, MD   50 mg at 06/26/13 2145  . loratadine (CLARITIN) tablet 10 mg  10 mg Oral Daily Kerry Hough, PA-C   10 mg at 06/27/13 9604  . magnesium hydroxide (MILK OF MAGNESIA) suspension 30 mL  30 mL Oral Daily PRN Kerry Hough, PA-C      . naproxen (NAPROSYN) tablet 500 mg  500 mg Oral BID WC Nehemiah Settle, MD   500 mg at 06/27/13 5409  . naproxen (NAPROSYN) tablet 500 mg  500 mg Oral Once Nehemiah Settle, MD      . ziprasidone (GEODON)  capsule 20 mg  20 mg Oral BID WC Nehemiah Settle, MD   20 mg at 06/27/13 8119    Lab Results: No results found for this or any previous visit (from the past 48 hour(s)).  Physical Findings: AIMS: Facial and Oral Movements Muscles of Facial Expression: None, normal Lips and Perioral Area: None, normal Jaw: None, normal Tongue: None, normal,Extremity Movements Upper (arms, wrists, hands, fingers): None, normal Lower (legs, knees, ankles, toes): None, normal, Trunk Movements Neck, shoulders, hips: None, normal, Overall Severity Severity of abnormal movements (highest score from questions above): None, normal Incapacitation due to abnormal movements: None, normal Patient's awareness of abnormal movements (rate only patient's report): No Awareness, Dental Status Current problems with teeth and/or dentures?: No Does patient usually wear dentures?: No  CIWA:    COWS:     Treatment Plan Summary: Daily contact with patient to assess and evaluate symptoms and progress in treatment Medication management  Plan: We will continue her current plan of care, and firm up followup plans.  Medical Decision Making Problem Points:  Established problem, stable/improving (1) and Review of psycho-social stressors (1) Data Points:  Review or order clinical lab tests (1) Review of medication regiment & side effects (2)  I certify that inpatient services furnished can reasonably be expected to improve the patient's condition.   Rebecca Solis 06/27/2013, 1:38 PM

## 2013-06-28 NOTE — Progress Notes (Signed)
D) Pt attends the groups and partisipates fully.  Rates her depression and hopelessness both at a 1. States she feels as though she has learned a lot here and is eager for change. Denies SI and HI. Interacts appropriately with her peers.  A) Given support reassurance and praised for her participation in the groups.  R) Denies SI and HI.

## 2013-06-28 NOTE — Progress Notes (Signed)
Patient ID: Rebecca Solis, female   DOB: May 24, 1971, 42 y.o.   MRN: [id number] [facility] MD Progress Note  06/28/2013 8:15 PM Rebecca Solis  MRN:  [id number] Subjective:     Less anxious. She feels that she is making improvement. She denies any suicidal or homicidal ideation. She endorses good sleep and appetite.  Diagnosis:   Axis I: Bipolar, Depressed Axis II: Deferred Axis III:  Past Medical History  Diagnosis Date  . Asthma   . Thyroid disease   . Anxiety   . Bipolar affective disorder   . Depression   . Arthritis   . Seizures     None for over a year.  Do not use Abilify    ADL's:  Intact  Sleep: Good  Appetite:  Good  Suicidal Ideation:  Patient denies any thought, plan, or intent Homicidal Ideation:  Patient denies any thought, plan, or intent AEB (as evidenced by):  Psychiatric Specialty Exam: Review of Systems  Constitutional: Negative.   HENT: Negative.   Eyes: Negative.   Respiratory: Negative.   Cardiovascular: Negative.   Gastrointestinal: Negative.   Genitourinary: Negative.   Musculoskeletal: Negative.   Skin: Negative.   Neurological: Negative.   Endo/Heme/Allergies: Negative.   Psychiatric/Behavioral: Negative for depression, suicidal ideas, hallucinations and substance abuse. The patient is not nervous/anxious and does not have insomnia.     Blood pressure 132/102, pulse 94, temperature 98.4 F (36.9 C), temperature source Oral, resp. rate 16, height 5\' 7"  (1.702 m), weight 91.173 kg (201 lb), last menstrual period 06/23/2013, SpO2 99.00%.Body mass index is 31.47 kg/(m^2).  General Appearance: Casual  Eye Contact::  Good  Speech:  Clear and Coherent  Volume:  Normal  Mood:  Dysphoric  Affect:  Congruent  Thought Process:  Linear  Orientation:  Full (Time, Place, and Person)  Thought Content:  WDL  Suicidal Thoughts:  No  Homicidal Thoughts:  No  Memory:  Immediate;   Good Recent;   Good Remote;   Good  Judgement:  Fair  Insight:   Fair  Psychomotor Activity:  Normal  Concentration:  Good  Recall:  Good  Akathisia:  No  Handed:  Right  AIMS (if indicated):     Assets:  Communication Skills Desire for Improvement Social Support  Sleep:  Number of Hours: 6.5   Current Medications: Current Facility-Administered Medications  Medication Dose Route Frequency Provider Last Rate Last Dose  . acetaminophen (TYLENOL) tablet 650 mg  650 mg Oral Q6H PRN Kerry Hough, PA-C      . albuterol (PROVENTIL HFA;VENTOLIN HFA) 108 (90 BASE) MCG/ACT inhaler 2 puff  2 puff Inhalation Q6H PRN Kerry Hough, PA-C      . alum & mag hydroxide-simeth (MAALOX/MYLANTA) 200-200-20 MG/5ML suspension 30 mL  30 mL Oral Q4H PRN Kerry Hough, PA-C      . benztropine (COGENTIN) tablet 1 mg  1 mg Oral BID Fransisca Kaufmann, NP   1 mg at 06/28/13 1705  . clonazePAM (KLONOPIN) tablet 0.5 mg  0.5 mg Oral BID AC & HS Fransisca Kaufmann, NP   0.5 mg at 06/28/13 0751  . DULoxetine (CYMBALTA) DR capsule 30 mg  30 mg Oral Daily Kerry Hough, PA-C   30 mg at 06/28/13 0751  . hydrOXYzine (ATARAX/VISTARIL) tablet 50 mg  50 mg Oral Q6H PRN Kerry Hough, PA-C   50 mg at 06/26/13 5621  . hydrOXYzine (ATARAX/VISTARIL) tablet 50 mg  50 mg Oral QHS PRN Nehemiah Settle, MD  50 mg at 06/27/13 2135  . loratadine (CLARITIN) tablet 10 mg  10 mg Oral Daily Kerry Hough, PA-C   10 mg at 06/28/13 0751  . magnesium hydroxide (MILK OF MAGNESIA) suspension 30 mL  30 mL Oral Daily PRN Kerry Hough, PA-C      . naproxen (NAPROSYN) tablet 500 mg  500 mg Oral BID WC Nehemiah Settle, MD   500 mg at 06/28/13 1706  . naproxen (NAPROSYN) tablet 500 mg  500 mg Oral Once Nehemiah Settle, MD      . ziprasidone (GEODON) capsule 20 mg  20 mg Oral BID WC Nehemiah Settle, MD   20 mg at 06/28/13 1705    Lab Results: No results found for this or any previous visit (from the past 48 hour(s)).  Physical Findings: AIMS: Facial and Oral  Movements Muscles of Facial Expression: None, normal Lips and Perioral Area: None, normal Jaw: None, normal Tongue: None, normal,Extremity Movements Upper (arms, wrists, hands, fingers): None, normal Lower (legs, knees, ankles, toes): None, normal, Trunk Movements Neck, shoulders, hips: None, normal, Overall Severity Severity of abnormal movements (highest score from questions above): None, normal Incapacitation due to abnormal movements: None, normal Patient's awareness of abnormal movements (rate only patient's report): No Awareness, Dental Status Current problems with teeth and/or dentures?: No Does patient usually wear dentures?: No  CIWA:    COWS:     Treatment Plan Summary: Daily contact with patient to assess and evaluate symptoms and progress in treatment Medication management  Plan: We will continue her current plan of care  Medical Decision Making Problem Points:  Established problem, stable/improving (1) and Review of psycho-social stressors (1) Data Points:  Review or order clinical lab tests (1) Review of medication regiment & side effects (2)  I certify that inpatient services furnished can reasonably be expected to improve the patient's condition.   Wonda Cerise 06/28/2013, 8:15 PM

## 2013-06-28 NOTE — [facility] Group Notes (Signed)
[facility] Group Notes:  (Clinical Social Work)  06/28/2013   3:00-4:00PM  Summary of Progress/Problems:   The main focus of today's process group was to   identify the patient's current support system and decide on other supports that can be put in place.  The picture on workbook was used to discuss why additional supports are needed, and a hand-out was distributed with four definitions/levels of support, then used to talk about how patients have given and received all different kinds of support.  An emphasis was placed on using counselor, doctor, therapy groups, 12-step groups, and problem-specific support groups to expand supports.  The patient identified one additional support as being her parents, whom she has not told about her feelings/situation and has not relied on because of their age.  However, they have been calling here concerned and she is going to talk to them.  This is in addition to her current supports of sister, children, hospital staff, God and fellow worshippers.  She spoke a great deal in group and was very supportive to others.  Type of Therapy:  Process Group  Participation Level:  Active  Participation Quality:  Appropriate, Attentive, Sharing and Supportive  Affect:  Appropriate  Cognitive:  Appropriate and Oriented  Insight:  Engaged  Engagement in Therapy:  Engaged  Modes of Intervention:  Education,  Support and [employer], LCSW 06/28/2013, 4:36 PM

## 2013-06-28 NOTE — Progress Notes (Signed)
[facility] Group Notes:  (Nursing/MHT/Case Management/Adjunct)  Date:  06/27/2013  Time:  2000  Type of Therapy:  Psychoeducational Skills  Participation Level:  Active  Participation Quality:  Attentive  Affect:  Appropriate  Cognitive:  Appropriate  Insight:  Improving  Engagement in Group:  Improving  Modes of Intervention:  Education  Summary of Progress/Problems: The patient shared with the group this evening that she had a good day. She indicated that she was no longer experiencing any symptoms of depression and that she wasn't having any pain or discomfort. Her goal for tomorrow is to have another good day.   Hazle Coca S 06/28/2013, 12:37 AM

## 2013-06-29 NOTE — Tx Team (Signed)
Interdisciplinary Treatment Plan Update (Adult)  Date: 06/29/2013  Time Reviewed:  9:45 AM  Progress in Treatment: Attending groups: Yes Participating in groups:  Yes Taking medication as prescribed:  Yes Tolerating medication:  Yes Family/Significant othe contact made: Yes Patient understands diagnosis:  Yes Discussing patient identified problems/goals with staff:  Yes Medical problems stabilized or resolved:  Yes Denies suicidal/homicidal ideation: Yes Issues/concerns per patient self-inventory:  Yes Other:  New problem(s) identified: N/A  Discharge Plan or Barriers: Pt has follow up scheduled at [facility] for medication management  Reason for Continuation of Hospitalization: Anxiety Depression Medication Stabilization  Comments: N/A  Estimated length of stay: 1 day, d/c tomorrow  For review of initial/current patient goals, please see plan of care.  Attendees: Patient:     Family:     Physician:   06/29/2013 9:46 AM   Nursing:   Chinita Greenland, RN 06/29/2013 9:46 AM   Clinical Social Worker:  Reyes Ivan, LCSWA 06/29/2013 9:46 AM   Other: Fransisca Kaufmann, NP 06/29/2013 9:46 AM   Other:  Frankey Shown, MA care coordination 06/29/2013 9:46 AM   Other:  Quintella Reichert, RN 06/29/2013 9:46 AM   Other:     Other:    Other:    Other:    Other:    Other:    Other:     Scribe for Treatment Team:   Carmina Miller, 06/29/2013 9:46 AM

## 2013-06-29 NOTE — Progress Notes (Signed)
Patient ID: Rebecca Solis, female   DOB: [birth year], 42 y.o.   MRN: [id number] [facility] MD Progress Note  06/29/2013 3:44 PM Tharon Kitch  MRN:  [id number] Subjective:   Patient states to writer "I feel good and refreshed. I think I am on the right medication regimen. I do not feel groggy which is important. I realize now that I need to be consistent in taking my medications." Patient rates her depression and anxiety at one. Denies SI. She feels stabilized for discharge and would like to leave tomorrow.   Diagnosis:   Axis I: Bipolar, Depressed Axis II: Deferred Axis III:  Past Medical History  Diagnosis Date  . Asthma   . Thyroid disease   . Anxiety   . Bipolar affective disorder   . Depression   . Arthritis   . Seizures     None for over a year.  Do not use Abilify    ADL's:  Intact  Sleep: Good  Appetite:  Good  Suicidal Ideation:  Patient denies any thought, plan, or intent Homicidal Ideation:  Patient denies any thought, plan, or intent AEB (as evidenced by):  Psychiatric Specialty Exam: Review of Systems  Constitutional: Negative.   HENT: Negative.   Eyes: Negative.   Respiratory: Negative.   Cardiovascular: Negative.   Gastrointestinal: Negative.   Genitourinary: Negative.   Musculoskeletal: Negative.   Skin: Negative.   Neurological: Negative.   Endo/Heme/Allergies: Negative.   Psychiatric/Behavioral: Negative for depression, suicidal ideas, hallucinations and substance abuse. The patient is not nervous/anxious and does not have insomnia.     Blood pressure 124/83, pulse 85, temperature 98.1 F (36.7 C), temperature source Oral, resp. rate 17, height 5\' 7"  (1.702 m), weight 91.173 kg (201 lb), last menstrual period 06/23/2013, SpO2 99.00%.Body mass index is 31.47 kg/(m^2).  General Appearance: Casual  Eye Contact::  Good  Speech:  Clear and Coherent  Volume:  Normal  Mood:  Dysphoric  Affect:  Congruent  Thought Process:  Linear  Orientation:  Full  (Time, Place, and Person)  Thought Content:  WDL  Suicidal Thoughts:  No  Homicidal Thoughts:  No  Memory:  Immediate;   Good Recent;   Good Remote;   Good  Judgement:  Fair  Insight:  Fair  Psychomotor Activity:  Normal  Concentration:  Good  Recall:  Good  Akathisia:  No  Handed:  Right  AIMS (if indicated):     Assets:  Communication Skills Desire for Improvement Social Support  Sleep:  Number of Hours: 6   Current Medications: Current Facility-Administered Medications  Medication Dose Route Frequency Provider Last Rate Last Dose  . acetaminophen (TYLENOL) tablet 650 mg  650 mg Oral Q6H PRN Kerry Hough, PA-C      . albuterol (PROVENTIL HFA;VENTOLIN HFA) 108 (90 BASE) MCG/ACT inhaler 2 puff  2 puff Inhalation Q6H PRN Kerry Hough, PA-C      . alum & mag hydroxide-simeth (MAALOX/MYLANTA) 200-200-20 MG/5ML suspension 30 mL  30 mL Oral Q4H PRN Kerry Hough, PA-C      . benztropine (COGENTIN) tablet 1 mg  1 mg Oral BID Fransisca Kaufmann, NP   1 mg at 06/29/13 0801  . clonazePAM (KLONOPIN) tablet 0.5 mg  0.5 mg Oral BID AC & HS Fransisca Kaufmann, NP   0.5 mg at 06/29/13 0802  . DULoxetine (CYMBALTA) DR capsule 30 mg  30 mg Oral Daily Kerry Hough, PA-C   30 mg at 06/29/13 7829  . hydrOXYzine (ATARAX/VISTARIL) tablet 50  mg  50 mg Oral Q6H PRN Kerry Hough, PA-C   50 mg at 06/26/13 1610  . hydrOXYzine (ATARAX/VISTARIL) tablet 50 mg  50 mg Oral QHS PRN Nehemiah Settle, MD   50 mg at 06/28/13 2107  . loratadine (CLARITIN) tablet 10 mg  10 mg Oral Daily Kerry Hough, PA-C   10 mg at 06/29/13 9604  . magnesium hydroxide (MILK OF MAGNESIA) suspension 30 mL  30 mL Oral Daily PRN Kerry Hough, PA-C      . naproxen (NAPROSYN) tablet 500 mg  500 mg Oral BID WC Nehemiah Settle, MD   500 mg at 06/29/13 0804  . naproxen (NAPROSYN) tablet 500 mg  500 mg Oral Once Nehemiah Settle, MD      . ziprasidone (GEODON) capsule 20 mg  20 mg Oral BID WC Nehemiah Settle, MD   20 mg at 06/29/13 5409    Lab Results: No results found for this or any previous visit (from the past 48 hour(s)).  Physical Findings: AIMS: Facial and Oral Movements Muscles of Facial Expression: None, normal Lips and Perioral Area: None, normal Jaw: None, normal Tongue: None, normal,Extremity Movements Upper (arms, wrists, hands, fingers): None, normal Lower (legs, knees, ankles, toes): None, normal, Trunk Movements Neck, shoulders, hips: None, normal, Overall Severity Severity of abnormal movements (highest score from questions above): None, normal Incapacitation due to abnormal movements: None, normal Patient's awareness of abnormal movements (rate only patient's report): No Awareness, Dental Status Current problems with teeth and/or dentures?: No Does patient usually wear dentures?: No  CIWA:  CIWA-Ar Total: 1 COWS:  COWS Total Score: 2  Treatment Plan Summary: Daily contact with patient to assess and evaluate symptoms and progress in treatment Medication management  Plan: Continue crisis management and stabilization.  Medication management: Reviewed with patient who stated no untoward effects. 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  Encouraged patient to attend groups and participate in group counseling sessions and activities.  Discharge plan in progress. Anticipate discharge tomorrow.  Address health issues: Patient complains of swelling in arms and legs. Advised to watch sodium content and elevate extremities.  Continue current treatment plan.   Medical Decision Making Problem Points:  Established problem, stable/improving (1) and Review of psycho-social stressors (1) Data Points:  Review or order clinical lab tests (1) Review of medication regiment & side effects (2)  I certify that inpatient services furnished can reasonably be expected to improve the patient's condition.   Cherise Fedder NP-C 06/29/2013, 3:44 PM

## 2013-06-29 NOTE — Progress Notes (Signed)
D:  Patient's self inventory sheet, patient sleeps well, good appetite, normal energy level, good attention span.  Rated depression and hopelessness #1.  Denied withdrawals.  Denied SI.  Has had right knee pain, knee surgery in 2008.  Pain goal #1 today, worst pain #5.  "To stay on a strict schedudle for taking meds.  Keep all of appointments.  Dismiss negative thoughts.  Seek help when feeling depressed.  Having swelling in feet, ankles and hands during last 3 days."  Does have discharge plans.  No problems taking meds after discharge. A:  Medications administered per MD orders.  Emotional support and encouragement given patient. R:  Denied SI and HI.  Denied A/V hallucinations.  Denied pain..   Will talk to MD about feet, ankles, hands swelling.

## 2013-06-29 NOTE — Progress Notes (Signed)
Patient ID: Rebecca Solis, female   DOB: 1971/08/07, 43 y.o.   MRN: [id number] D)  Seems to be brighter this evening, interacting appropriately with peers and staff, sense of humor returning, smiling and pleasant.  Has been in the dayroom and participating in the milieu, states is feeling better, learning quite a bit, gaining insight.   A)  Will continue to monitor for safety, continue POC R)  Safety maintained.

## 2013-06-29 NOTE — [facility] Group Notes (Signed)
[facility] LCSW Group Therapy  06/29/2013  1:15 PM   Type of Therapy:  Group Therapy  Participation Level:  Active  Participation Quality:  Appropriate and Attentive  Affect:  Appropriate  Cognitive:  Alert and Appropriate  Insight:  Developing/Improving and Engaged  Engagement in Therapy:  Developing/Improving and Engaged  Modes of Intervention:  Clarification, Confrontation, Discussion, Education, Exploration, Limit-setting, Orientation, Problem-solving, Rapport Building, Dance movement psychotherapist, Socialization and Support  Summary of Progress/Problems: Pt identified obstacles faced currently and processed barriers involved in overcoming these obstacles. Pt identified steps necessary for overcoming these obstacles and explored motivation (internal and external) for facing these difficulties head on. Pt further identified one area of concern in their lives and chose a goal to focus on for today.  Pt shared that she is her own obstacle.  Pt was able to process how she shuts people out or feels guilty and has a hard time forgiving herself.  Pt states that she plans to think through these behaviors and be more forgiving to herself.  Pt actively participated and was engaged in group discussion.    Rebecca Solis, [place] 06/29/2013 2:43 PM

## 2013-06-29 NOTE — [facility] Group Notes (Signed)
[facility] LCSW Aftercare Discharge Planning Group Note   06/29/2013  8:45 AM  Participation Quality:  Alert and Appropriate   Mood/Affect:  Appropriate, Flat and Depressed  Depression Rating:  0  Anxiety Rating:  0  Thoughts of Suicide:  Pt denies SI/HI  Will you contract for safety?   Yes  Current AVH:  Pt denies  Plan for Discharge/Comments:  Pt attended discharge planning group and actively participated in group.  CSW provided pt with today's workbook.  Pt states that she is nervous about going home and beng in an empty home.  Pt states that she resides in [facility] and will stay with her parents for added support upon d/c.  Pt has follow up scheduled at [facility] for medication management.  No further needs voiced by pt at this time.    Transportation Means: Pt reports having access to transportation  Supports: Pt names her parents as supportive  Reyes Ivan, LCSWA 06/29/2013 12:09 PM

## 2013-06-29 NOTE — Progress Notes (Signed)
Grief and Loss Group  Group members processed their feelings and experiences related to grief and loss. Group members shared helpful coping strategies in dealing with their grief and loss.   Pt left five minutes after group started.   Sherol Dade  Counselor Intern  Haroldine Laws

## 2013-06-29 NOTE — Progress Notes (Addendum)
Recreation Therapy Notes   Date: 07.07.2014 Time: 3:00pm Location: [address] Dayroom      Group Topic/Focus: Leisure Education  Participation Level: Active  Participation Quality: Appropriate  Affect: Euthymic  Cognitive: Appropriate   Additional Comments: Activity: Leisure Alphabet ; Explanation: Patients were asked to select a letter of the alphabet from a container, then state a leisure/recreation activity that starts with that letter of the alphabet.  Peer defined wellness as: Mind, body & spirit working in concert. Patient as part of the group agreed to use that as group definition of wellness.   Patient actively participated in activity. Patient suggested using "sex" for the letter "s" after group discussion patient settled on sleeping as leisure/recreation activity. Patient stated an appropriate leisure/recreation activities for additional letter selected. Patient encouraged and assisted peers as needed. Patient contributed to discussion about the benefits of leisure/recreation. Patient identified enjoyment as a benefit of recreation/leisure. Patient successfully related group list to each aspect of group definition of wellness.   Marykay Lex [facility], LRT/CTRS  Jearl Klinefelter 06/29/2013 3:48 PM

## 2013-06-29 NOTE — Progress Notes (Signed)
Adult Psychoeducational Group Note  Date:  06/29/2013 Time:  3:09 PM  Group Topic/Focus:  Wellness Toolbox:   The focus of this group is to discuss various aspects of wellness, balancing those aspects and exploring ways to increase the ability to experience wellness.  Patients will create a wellness toolbox for use upon discharge.  Participation Level:  Active  Participation Quality:  Appropriate, Attentive and Sharing  Affect:  Appropriate  Cognitive:  Alert and Appropriate  Insight: Appropriate  Engagement in Group:  Engaged  Modes of Intervention:  Discussion  Additional Comments:  Pt was appropriate and sharing while attending group. Pt shared that wellness to her was having peace. She plans to work on her mind not being so cloudy once she is discharged.   Sharyn Lull 06/29/2013, 3:09 PM

## 2013-06-30 DIAGNOSIS — F4310 Post-traumatic stress disorder, unspecified: Secondary | ICD-10-CM

## 2013-06-30 MED ORDER — LORATADINE 10 MG PO TABS: 10.0000 mg | ORAL_TABLET | Freq: Every day | ORAL | Status: AC

## 2013-06-30 MED ORDER — LEVONORGESTREL 20 MCG/24HR IU IUD: 1.0000 | INTRAUTERINE_SYSTEM | Freq: Once | INTRAUTERINE | Status: AC

## 2013-06-30 MED ORDER — CLONAZEPAM 0.5 MG PO TABS: 0.5000 mg | ORAL_TABLET | Freq: Two times a day (BID) | ORAL | Status: AC

## 2013-06-30 MED ORDER — LEVONORGESTREL 20 MCG/24HR IU IUD: 1.0000 | INTRAUTERINE_SYSTEM | Freq: Once | INTRAUTERINE | Status: DC

## 2013-06-30 MED ORDER — ALBUTEROL SULFATE HFA 108 (90 BASE) MCG/ACT IN AERS: 2.0000 | INHALATION_SPRAY | Freq: Four times a day (QID) | RESPIRATORY_TRACT | Status: AC | PRN

## 2013-06-30 MED ORDER — DULOXETINE HCL 30 MG PO CPEP: 30.0000 mg | ORAL_CAPSULE | Freq: Every day | ORAL | Status: AC

## 2013-06-30 MED ORDER — HYDROXYZINE HCL 50 MG PO TABS: 50.0000 mg | ORAL_TABLET | Freq: Every evening | ORAL | Status: AC | PRN

## 2013-06-30 MED ORDER — CLONAZEPAM 0.5 MG PO TABS: 0.5000 mg | ORAL_TABLET | Freq: Two times a day (BID) | ORAL | Status: DC

## 2013-06-30 MED ORDER — BENZTROPINE MESYLATE 1 MG PO TABS: 1.0000 mg | ORAL_TABLET | Freq: Two times a day (BID) | ORAL | Status: AC

## 2013-06-30 MED ORDER — NAPROXEN 500 MG PO TABS: 500.0000 mg | ORAL_TABLET | Freq: Two times a day (BID) | ORAL | Status: AC

## 2013-06-30 MED ORDER — ZIPRASIDONE HCL 20 MG PO CAPS: 20.0000 mg | ORAL_CAPSULE | Freq: Two times a day (BID) | ORAL | Status: AC

## 2013-06-30 NOTE — Progress Notes (Signed)
D:  Patient's self inventory sheet, patient sleeps well, good appetite, high energy level, good attention span.  Rated depression and hopelessness #1.  Denied withdrawals.  Denied SI.  Has experienced pain in past 24 hours.  Pain goal today #1, worst pain #5.  "Eat 3 meals a day, go to all appointments, stay on meds, get 6-8 hours of sleep, keep my support informed about my wellness.  I want to thank everyone who took part ian my recovery.  I am truly thankful and appreciative." A:  Medications administered per MD orders.  Emotional support and encouragement given patient. R:  Denied SI and HI.  Denied A/V hallucinations.  Denied pain.  Will continue to monitor patient for safety with 15 minute checks.  Safety maintained.

## 2013-06-30 NOTE — Progress Notes (Signed)
Discharge Note:  Patient discharged home.  Patient received all her belongings, clothing, miscellaneous items, toiletries, prescriptions, medications.  Suicide prevention information given and discussed with patient who stated she understood and had no questions.  Denied SI and HI.   Denied A/V hallucinations.  Denied pain.  Patient stated she appreciated all assistance received from  staff while at [facility].

## 2013-06-30 NOTE — [facility] Group Notes (Signed)
[facility] LCSW Group Therapy  06/30/2013  1:15 PM   Type of Therapy:  Group Therapy  Participation Level:  Did Not Attend - pt was being discharged  [facility], LCSWA 06/30/2013 2:00 PM
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
## 2013-06-30 NOTE — Progress Notes (Signed)
D: Pt is appropriate in affect and mood. Pt rates her depression and anxiety at a level one out of ten. Pt attended group this evening. Pt observed interacting appropriately within the milieu. Pt reports her medication as effective without the feeling of grogginess.  A: Writer administered scheduled and prn medications to pt. Continued support and availability as needed was extended to this pt. Staff continue to monitor pt with q35min checks.  R: No adverse drug reactions noted. Pt receptive to treatment. Pt remains safe at this time.

## 2013-06-30 NOTE — Discharge Summary (Signed)
Physician Discharge Summary Note  Patient:  Rebecca Solis is an 42 y.o., female MRN:  [id number] DOB:  [birth year] Patient phone:  [phone] (home)  Patient address:   [address] [place] [address]   Date of Admission:  06/23/2013 Date of Discharge: 06/30/2013  Discharge Diagnoses: Active Problems:   * No active hospital problems. *  Axis Diagnosis:  AXIS I: Bipolar Depressed, PTSD  AXIS II: Deferred  AXIS III:  Past Medical History   Diagnosis  Date   .  Asthma    .  Thyroid disease    .  Anxiety    .  Bipolar affective disorder    .  Depression    .  Arthritis    .  Seizures      None for over a year. Do not use Abilify    AXIS IV: other psychosocial or environmental problems  AXIS V: 61-70 mild symptoms  Level of Care:  OP  Hospital Course:   Rebecca Solis is an 42 y.o. female. Pt was brought to [facility] by her sister. Patient says that she knows the signs that she is decompensating. Increased thoughts of killing self over the last day or two. No plan but cannot contract for safety. Pt has history of three prior suicide attempts by overdose. Patient has thoughts of harm to others "if they make me mad because I am more irritable when I am depressed." Patient has no A/V hallucinations. Patient has not been to see Dr. Thurston Hole at [facility] for a few months. She said that she does have an July 8. Patient has been off medications for bi-polar d/o for months. Has been off anxiety meds for the last three days.   While a patient in this hospital, Rebecca Solis was enrolled in group counseling and activities as well as received the following medication Current facility-administered medications:acetaminophen (TYLENOL) tablet 650 mg, 650 mg, Oral, Q6H PRN, Mena Goes Simon, PA-C;  albuterol (PROVENTIL HFA;VENTOLIN HFA) 108 (90 BASE) MCG/ACT inhaler 2 puff, 2 puff, Inhalation, Q6H PRN, Kerry Hough, PA-C;  alum & mag hydroxide-simeth (MAALOX/MYLANTA)  200-200-20 MG/5ML suspension 30 mL, 30 mL, Oral, Q4H PRN, Kerry Hough, PA-C, 30 mL at 06/29/13 2113 benztropine (COGENTIN) tablet 1 mg, 1 mg, Oral, BID, Fransisca Kaufmann, NP, 1 mg at 06/30/13 4742;  clonazePAM (KLONOPIN) tablet 0.5 mg, 0.5 mg, Oral, BID AC & HS, Fransisca Kaufmann, NP, 0.5 mg at 06/30/13 0806;  DULoxetine (CYMBALTA) DR capsule 30 mg, 30 mg, Oral, Daily, Mena Goes Simon, PA-C, 30 mg at 06/30/13 5956;  hydrOXYzine (ATARAX/VISTARIL) tablet 50 mg, 50 mg, Oral, Q6H PRN, Kerry Hough, PA-C, 50 mg at 06/29/13 1655 hydrOXYzine (ATARAX/VISTARIL) tablet 50 mg, 50 mg, Oral, QHS PRN, Nehemiah Settle, MD, 50 mg at 06/29/13 2115;  loratadine (CLARITIN) tablet 10 mg, 10 mg, Oral, Daily, Kerry Hough, PA-C, 10 mg at 06/30/13 3875;  magnesium hydroxide (MILK OF MAGNESIA) suspension 30 mL, 30 mL, Oral, Daily PRN, Mena Goes Simon, PA-C;  naproxen (NAPROSYN) tablet 500 mg, 500 mg, Oral, BID WC, Nehemiah Settle, MD, 500 mg at 06/30/13 6433 naproxen (NAPROSYN) tablet 500 mg, 500 mg, Oral, Once, Nehemiah Settle, MD;  ziprasidone (GEODON) capsule 20 mg, 20 mg, Oral, BID WC, Nehemiah Settle, MD, 20 mg at 06/30/13 0807 Current outpatient prescriptions:albuterol (PROVENTIL HFA;VENTOLIN HFA) 108 (90 BASE) MCG/ACT inhaler, Inhale 2 puffs into the lungs every 6 (six) hours as needed for shortness of breath., Disp: , Rfl: ;  benztropine (COGENTIN) 1 MG tablet, Take 1 tablet (1 mg total) by mouth 2 (two) times daily., Disp: 60 tablet, Rfl: 0 clonazePAM (KLONOPIN) 0.5 MG tablet, Take 1 tablet (0.5 mg total) by mouth 2 (two) times daily at 8 am and 10 pm., Disp: 30 tablet, Rfl: 0;  DULoxetine (CYMBALTA) 30 MG capsule, Take 1 capsule (30 mg total) by mouth daily. For depression, Disp: 30 capsule, Rfl: 0;  hydrOXYzine (ATARAX/VISTARIL) 50 MG tablet, Take 1 tablet (50 mg total) by mouth at bedtime as needed for anxiety (insomnia)., Disp: 30 tablet, Rfl: 0 levonorgestrel (MIRENA) 20 MCG/24HR  IUD, 1 Intra Uterine Device (1 each total) by Intrauterine route once. Inserted September 2008, Disp: 1 each, Rfl: 0;  loratadine (CLARITIN) 10 MG tablet, Take 1 tablet (10 mg total) by mouth daily., Disp: , Rfl: ;  naproxen (NAPROSYN) 500 MG tablet, Take 1 tablet (500 mg total) by mouth 2 (two) times daily with a meal. For pain, Disp: 60 tablet, Rfl: 0 ziprasidone (GEODON) 20 MG capsule, Take 1 capsule (20 mg total) by mouth 2 (two) times daily with a meal. For mood control., Disp: 60 capsule, Rfl: 0 The patient's medications were managed by the MD. On admission her Cymbalta 30 mg was continued to treat her depression. The patient was started on Geodon 20 mg po bid to help stabilize her mood. After being started on geodon the patient complained of experiencing muscle clenching in arms and jaw. She was started on Cogentin 1 mg bid which resolved this problem. The patient was also placed on Klonopin 0.5 mg po bid to help address symptoms of anxiety. The patient received Vistaril 50 mg at hs prn to help improve quality of sleep. After these changes were made the patient began to feel that her mood had significantly improved stating "I feel good. I don't feel oversedated like I have in the past. This is a good regimen for me." Patient attended treatment team meeting this am and met with treatment team members. Pt symptoms, treatment plan and response to treatment discussed. Rebecca Solis endorsed that their symptoms have improved. Pt also stated that they are stable for discharge.  In other to control Active Problems:   * No active hospital problems. * , they will continue psychiatric care on outpatient basis. They will follow-up at  Follow-up Information   Follow up with [facility] On 07/08/2013. (Appointment scheduled at 11:15 am with Dr. Arletha Pili for medication management)    Contact information:   [address] [place], [place]   [address] [phone]    .  In addition  they were instructed to take all your medications as prescribed by your mental healthcare provider, to report any adverse effects and or reactions from your medicines to your outpatient provider promptly, patient is instructed and cautioned to not engage in alcohol and or illegal drug use while on prescription medicines, in the event of worsening symptoms, patient is instructed to call the crisis hotline, 911 and or go to the nearest ED for appropriate evaluation and treatment of symptoms.   Upon discharge, patient adamantly denies suicidal, homicidal ideations, auditory, visual hallucinations and or delusional thinking. They left [facility] with all personal belongings in no apparent distress.  Consults:  See electronic record for details  Significant Diagnostic Studies:  See electronic record for details  Discharge Vitals:   Blood pressure 116/88, pulse 93, temperature 98.4 F (36.9 C), temperature source Oral, resp. rate 18, height 5\' 7"  (1.702 m), weight  91.173 kg (201 lb), last menstrual period 06/23/2013, SpO2 99.00%..  Mental Status Exam: See Mental Status Examination and Suicide Risk Assessment completed by Attending Physician prior to discharge.  Discharge destination:  Home  Is patient on multiple antipsychotic therapies at discharge:  No  Has Patient had three or more failed trials of antipsychotic monotherapy by history: N/A Recommended Plan for Multiple Antipsychotic Therapies: N/A Discharge Orders   Future Orders Complete By Expires     Activity as tolerated - No restrictions  As directed         Medication List    STOP taking these medications       diphenhydrAMINE 25 MG tablet  Commonly known as:  SOMINEX      TAKE these medications     Indication   albuterol 108 (90 BASE) MCG/ACT inhaler  Commonly known as:  PROVENTIL HFA;VENTOLIN HFA  Inhale 2 puffs into the lungs every 6 (six) hours as needed for shortness of breath.   Indication:  Asthma, Exercise-Induced  Bronchospasm     benztropine 1 MG tablet  Commonly known as:  COGENTIN  Take 1 tablet (1 mg total) by mouth 2 (two) times daily.   Indication:  Extrapyramidal Reaction caused by Medications     clonazePAM 0.5 MG tablet  Commonly known as:  KLONOPIN  Take 1 tablet (0.5 mg total) by mouth 2 (two) times daily at 8 am and 10 pm.   Indication:  Panic Disorder     DULoxetine 30 MG capsule  Commonly known as:  CYMBALTA  Take 1 capsule (30 mg total) by mouth daily. For depression   Indication:  Major Depressive Disorder, Musculoskeletal Pain     hydrOXYzine 50 MG tablet  Commonly known as:  ATARAX/VISTARIL  Take 1 tablet (50 mg total) by mouth at bedtime as needed for anxiety (insomnia).      levonorgestrel 20 MCG/24HR IUD  Commonly known as:  MIRENA  1 Intra Uterine Device (1 each total) by Intrauterine route once. Inserted September 2008   Indication:  birth control method     loratadine 10 MG tablet  Commonly known as:  CLARITIN  Take 1 tablet (10 mg total) by mouth daily.   Indication:  Hayfever     naproxen 500 MG tablet  Commonly known as:  NAPROSYN  Take 1 tablet (500 mg total) by mouth 2 (two) times daily with a meal. For pain   Indication:  Mild to Moderate Pain     ziprasidone 20 MG capsule  Commonly known as:  GEODON  Take 1 capsule (20 mg total) by mouth 2 (two) times daily with a meal. For mood control.   Indication:  Manic-Depression           Follow-up Information   Follow up with [facility] On 07/08/2013. (Appointment scheduled at 11:15 am with Dr. Arletha Pili for medication management)    Contact information:   [address] [place], [place]   [address] [phone]     Follow-up recommendations:   Activities: Resume typical activities Diet: Resume typical diet Tests: none Other: Follow up with outpatient provider and report any side effects to out patient prescriber. Continue to work on the life style changes that could help  stabilize your mood Comments:  Take all your medications as prescribed by your mental healthcare provider. Report any adverse effects and or reactions from your medicines to your outpatient provider promptly. Patient is instructed and cautioned to not engage in alcohol and or  illegal drug use while on prescription medicines. In the event of worsening symptoms, patient is instructed to call the crisis hotline, 911 and or go to the nearest ED for appropriate evaluation and treatment of symptoms. Follow-up with your primary care provider for your other medical issues, concerns and or health care needs.  SignedFransisca Kaufmann NP-C 06/30/2013 4:50 PM Agree with assessment and plan Madie Reno A. Dub Mikes, M.D.

## 2013-06-30 NOTE — BHH Group Notes (Signed)
[facility] LCSW Aftercare Discharge Planning Group Note   06/30/2013  8:45 AM  Participation Quality:  Alert and Appropriate   Mood/Affect:  Appropriate, Calm   Depression Rating:  0  Anxiety Rating:  0  Thoughts of Suicide:  Pt denies SI/HI  Will you contract for safety?   Yes  Current AVH:  Pt denies  Plan for Discharge/Comments:  Pt attended discharge planning group and actively participated in group.  CSW provided pt with today's workbook.  Pt reports feeling stable to d/c today.  Pt states that she resides in [facility] and will stay with her parents for added support upon d/c.  Pt has follow up scheduled at [facility] for medication management.  No further needs voiced by pt at this time.    Transportation Means: Pt reports having access to transportation  Supports: Pt names her parents as supportive  Reyes Ivan, LCSWA 06/30/2013 10:18 AM

## 2013-06-30 NOTE — Progress Notes (Addendum)
Adult Psychoeducational Group Note  Date:  06/30/2013 Time:  1:18 PM  Group Topic/Focus:  Recovery Goals:   The focus of this group is to identify appropriate goals for recovery and establish a plan to achieve them.  Participation Level:  Active  Participation Quality:  Appropriate, Attentive and Sharing  Affect:  Appropriate  Cognitive:  Alert and Appropriate  Insight: Appropriate  Engagement in Group:  Engaged  Modes of Intervention:  Discussion  Additional Comments:  Pt was appropriate and sharing while attending group. Pt shared that self-negativity and not forgiving herself of past mistakes are barriers for her. Pt realizes that it is a a process and that things will begin to get better.  Sharyn Lull 06/30/2013, 1:18 PM

## 2013-06-30 NOTE — Progress Notes (Signed)
[facility] Adult Case Management Discharge Plan :  Will you be returning to the same living situation after discharge: Yes,  returning home At discharge, do you have transportation home?:Yes,  access to transportation Do you have the ability to pay for your medications:Yes,  access to meds  Release of information consent forms completed and in the chart;  Patient's signature needed at discharge.  Patient to Follow up at: Follow-up Information   Follow up with [facility] On 07/08/2013. (Appointment scheduled at 11:15 am with Dr. Arletha Pili for medication management)    Contact information:   [address] [place], [place]   [address] [phone]      Patient denies SI/HI:   Yes,  denies SI/HI    Safety Planning and Suicide Prevention discussed:  Yes,  discussed with pt, unable to reach pt's mother.  See suicide prevention note  Horton, Salome Arnt 06/30/2013, 10:20 AM

## 2013-07-03 NOTE — Progress Notes (Signed)
Patient Discharge Instructions:  After Visit Summary (AVS):   Faxed to:  07/03/13 Discharge Summary Note:   Faxed to:  07/03/13 Psychiatric Admission Assessment Note:   Faxed to:  07/03/13 Suicide Risk Assessment - Discharge Assessment:   Faxed to:  07/03/13 Faxed/Sent to the Next Level Care provider:  07/03/13 Faxed to [facility] @ [phone]  Jerelene Redden, 07/03/2013, 3:08 PM

## 2013-07-24 NOTE — Consult Note (Signed)
Reviewed the information documented and agree with the treatment plan.  [name],JANARDHAHA R. 07/24/2013 7:16 PM

## 2014-02-02 ENCOUNTER — Ambulatory Visit: Payer: Medicaid Other | Attending: Orthopedic Surgery | Admitting: Physical Therapy

## 2014-02-02 DIAGNOSIS — R269 Unspecified abnormalities of gait and mobility: Secondary | ICD-10-CM | POA: Insufficient documentation

## 2014-02-02 DIAGNOSIS — M25673 Stiffness of unspecified ankle, not elsewhere classified: Secondary | ICD-10-CM | POA: Insufficient documentation

## 2014-02-02 DIAGNOSIS — R609 Edema, unspecified: Secondary | ICD-10-CM | POA: Insufficient documentation

## 2014-02-02 DIAGNOSIS — M25676 Stiffness of unspecified foot, not elsewhere classified: Secondary | ICD-10-CM | POA: Insufficient documentation

## 2014-02-02 DIAGNOSIS — M959 Acquired deformity of musculoskeletal system, unspecified: Secondary | ICD-10-CM | POA: Insufficient documentation

## 2014-02-02 DIAGNOSIS — IMO0001 Reserved for inherently not codable concepts without codable children: Secondary | ICD-10-CM | POA: Insufficient documentation

## 2014-02-02 DIAGNOSIS — M6281 Muscle weakness (generalized): Secondary | ICD-10-CM | POA: Insufficient documentation

## 2014-02-02 DIAGNOSIS — M25579 Pain in unspecified ankle and joints of unspecified foot: Secondary | ICD-10-CM | POA: Insufficient documentation

## 2014-02-02 DIAGNOSIS — M76829 Posterior tibial tendinitis, unspecified leg: Secondary | ICD-10-CM | POA: Insufficient documentation

## 2019-03-11 ENCOUNTER — Telehealth: Payer: Self-pay | Admitting: Family

## 2019-03-11 DIAGNOSIS — J4520 Mild intermittent asthma, uncomplicated: Secondary | ICD-10-CM

## 2019-03-11 MED ORDER — BENZONATATE 100 MG PO CAPS: 100.0000 mg | ORAL_CAPSULE | Freq: Three times a day (TID) | ORAL | 0 refills | Status: AC | PRN

## 2019-03-11 MED ORDER — PREDNISONE 10 MG (21) PO TBPK: ORAL_TABLET | ORAL | 0 refills | Status: AC

## 2019-03-11 NOTE — Progress Notes (Signed)
We are sorry that you are not feeling well.  Here is how we plan to help!  Based on your presentation I believe you most likely have A cough due to a virus.  This is called viral bronchitis and is best treated by rest, plenty of fluids and control of the cough.  You may use Ibuprofen or Tylenol as directed to help your symptoms.     In addition you may use A non-prescription cough medication called Robitussin DAC. Take 2 teaspoons every 8 hours or Delsym: take 2 teaspoons every 12 hours., A non-prescription cough medication called Mucinex DM: take 2 tablets every 12 hours. and A prescription cough medication called Tessalon Perles 100mg. You may take 1-2 capsules every 8 hours as needed for your cough.  Prednisone 10 mg daily for 6 days (see taper instructions below)  Approximately 5 minutes was spent documenting and reviewing patient's chart.   From your responses in the eVisit questionnaire you describe inflammation in the upper respiratory tract which is causing a significant cough.  This is commonly called Bronchitis and has four common causes:    Allergies  Viral Infections  Acid Reflux  Bacterial Infection Allergies, viruses and acid reflux are treated by controlling symptoms or eliminating the cause. An example might be a cough caused by taking certain blood pressure medications. You stop the cough by changing the medication. Another example might be a cough caused by acid reflux. Controlling the reflux helps control the cough.  USE OF BRONCHODILATOR ("RESCUE") INHALERS: There is a risk from using your bronchodilator too frequently.  The risk is that over-reliance on a medication which only relaxes the muscles surrounding the breathing tubes can reduce the effectiveness of medications prescribed to reduce swelling and congestion of the tubes themselves.  Although you feel brief relief from the bronchodilator inhaler, your asthma may actually be worsening with the tubes becoming more  swollen and filled with mucus.  This can delay other crucial treatments, such as oral steroid medications. If you need to use a bronchodilator inhaler daily, several times per day, you should discuss this with your provider.  There are probably better treatments that could be used to keep your asthma under control.     HOME CARE . Only take medications as instructed by your medical team. . Complete the entire course of an antibiotic. . Drink plenty of fluids and get plenty of rest. . Avoid close contacts especially the very young and the elderly . Cover your mouth if you cough or cough into your sleeve. . Always remember to wash your hands . A steam or ultrasonic humidifier can help congestion.   GET HELP RIGHT AWAY IF: . You develop worsening fever. . You become short of breath . You cough up blood. . Your symptoms persist after you have completed your treatment plan MAKE SURE YOU   Understand these instructions.  Will watch your condition.  Will get help right away if you are not doing well or get worse.  Your e-visit answers were reviewed by a board certified advanced clinical practitioner to complete your personal care plan.  Depending on the condition, your plan could have included both over the counter or prescription medications. If there is a problem please reply  once you have received a response from your provider. Your safety is important to us.  If you have drug allergies check your prescription carefully.    You can use MyChart to ask questions about today's visit, request a non-urgent call   back, or ask for a work or school excuse for 24 hours related to this e-Visit. If it has been greater than 24 hours you will need to follow up with your provider, or enter a new e-Visit to address those concerns. You will get an e-mail in the next two days asking about your experience.  I hope that your e-visit has been valuable and will speed your recovery. Thank you for using  e-visits.   

## 2023-07-25 ENCOUNTER — Emergency Department: Payer: Medicaid Other

## 2023-07-25 ENCOUNTER — Emergency Department
Admission: EM | Admit: 2023-07-25 | Discharge: 2023-07-25 | Disposition: A | Discharge status: Home / Self Care | Payer: Medicaid Other | Attending: Emergency Medicine | Admitting: Emergency Medicine

## 2023-07-25 DIAGNOSIS — M25562 Pain in left knee: Secondary | ICD-10-CM | POA: Insufficient documentation

## 2023-07-25 DIAGNOSIS — Y92008 Other place in unspecified non-institutional (private) residence as the place of occurrence of the external cause: Secondary | ICD-10-CM | POA: Insufficient documentation

## 2023-07-25 DIAGNOSIS — X501XXA Overexertion from prolonged static or awkward postures, initial encounter: Secondary | ICD-10-CM | POA: Insufficient documentation

## 2023-07-25 DIAGNOSIS — Y9389 Activity, other specified: Secondary | ICD-10-CM | POA: Insufficient documentation

## 2023-07-25 DIAGNOSIS — S92145A Nondisplaced dome fracture of left talus, initial encounter for closed fracture: Secondary | ICD-10-CM | POA: Insufficient documentation

## 2023-07-25 DIAGNOSIS — W109XXA Fall (on) (from) unspecified stairs and steps, initial encounter: Secondary | ICD-10-CM | POA: Insufficient documentation

## 2023-07-25 MED ORDER — IBUPROFEN 600 MG PO TABS
600.0000 mg | ORAL_TABLET | Freq: Four times a day (QID) | ORAL | 0 refills | Status: AC | PRN
Start: 2023-07-25 — End: ?

## 2023-07-25 NOTE — ED Provider Notes (Signed)
[facility] EMERGENCY DEPARTMENT  ATTENDING PHYSICIAN SUPERVISORY ATTESTATION NOTE     Patient Name: Joy Walker, Joy Walker  Encounter Date:  07/25/2023  Attending Physician: Reginia Forts, MD  Room:  [facility]  Patient DOB:  1971-09-26  Age: 52 y.o. female  MRN:  [id number]  PCP: Marisa Sprinkles, MD         Diagnosis/Disposition:   Final Impression  Final diagnoses:   Closed nondisplaced fracture of dome of left talus, initial encounter     Disposition  ED Disposition       ED Disposition   Discharge    Condition   --    Date/Time   Thu Jul 25, 2023 11:30 AM    Comment   Joy Walker discharge to home/self care.    Condition at disposition: Stable                   MDM:      Initial Assessment and Differential Diagnosis:  Joy Walker is a 52 y.o. female presenting with L ankle pain and swelling s/p  fall a few weeks ago. Reports history of torn ACL and meniscus in the L knee.    Pertinent Physical Exam Findings:   Vital signs grossly within normal limits.   Constitutional:  Alert, Comfortable. Full sentences.    MDM/Plan:  52 y.o. female presenting with left ankle /knee injury.  Motor strength / sensory / vacularly intact. No other signs of injury on trauma evaluation.    Ankle Left 3+ Views  1.Suspected osteochondral injury of the medial talar dome can be further characterized with MRI as clinically warranted.     Knee 3 View Left  1.No evidence for fracture or dislocation of the left knee. .    Discussed possibility for occult fracture, tendon, ligament, muscular injury.   Do not suspect DVT, acute limb ischemia, septic joint, compartment syndrome, cellulitis, non-accidental trauma at the time of inital evaluation and impression.  Discussed case with podiatry and cleared patient to be in walking boot.   Plan for pain management, appropriate walking boot with outpatient Orthopedic / podiatry surgery follow-up in the next 2-3 days.             Interpretations, Clinical Decision Tools and Critical Care:      O2 Sat:  The patient's oxygen saturation was 99 % on room air. This was independently interpreted by me as Normal.      The patient's past medical records, including those in Care Everywhere when necessary, were reviewed by me.         ATTESTATIONS     Reginia Forts, MD    Scribe Attestation:  I am the first provider for this patient and I personally performed the services documented. Joy Walker is scribing for me on this chart. This note and the patient instructions accurately reflect work and decisions made by me.      Midlevel Provider Attestation:   The patient was seen and examined by the mid-level (resident, physician assistant or nurse practitioner) and the plan of care was discussed with me. I agree with the plan as it was presented to me. I personally saw the patient and made/approved the management plan and take responsibility for the patient management. Please see the separately documented midlevel note for additional information including but not limited to full history of present illness, review of systems and comprehensive physical exam.            Marko Stai, MD  07/26/23  2115

## 2023-07-25 NOTE — ED Notes (Signed)
Ambulation trial was done with patient. Patient ambulated with an steady gait to the ED lobby.

## 2023-07-25 NOTE — ED Triage Notes (Signed)
Patient presents to ED endorsing left ankle and knee pain. Patient states that she fell about two months ago and twisted her knee. Pt states she did not have insurance at that time and did not get evaluated at time of injury. Patient has known meniscus tear and torn ACL , has never had surgery on the knee. Patient is able to bear weight with discomfort, denies any numbness/tingling in extremity. A&Ox4, respirations even and unlabored, NAD at this time.

## 2023-07-25 NOTE — Progress Notes (Signed)
ORTHO TECH SERVICES: Pt fitted with Aircast cam boot to LLE, as ordered.  Well tolerated by patient.  Instructions given.      Tatyana Perenesenko  Orthopedic Technician       Pg # [id number]

## 2023-07-25 NOTE — Discharge Instructions (Signed)
Dear Ms. Bednarczyk:    Thank you for choosing the [facility] Emergency Department, the premier emergency department in the [place] area.  I hope your visit today was EXCELLENT. You will receive a survey via text message that will give you the opportunity to provide feedback to your team about your visit. Please do not hesitate to reach out with any questions!    Specific instructions for your visit today:      IF YOU DO NOT CONTINUE TO IMPROVE OR YOUR CONDITION WORSENS, PLEASE CONTACT YOUR DOCTOR OR RETURN IMMEDIATELY TO THE EMERGENCY DEPARTMENT.    Sincerely,  Shahkolahi, Blase Mess, MD  Attending Emergency Physician  [facility] Emergency Department      OBTAINING A PRIMARY CARE APPOINTMENT    Primary care physicians (PCPs, also known as primary care doctors) are either internists or family medicine doctors. Both types of PCPs focus on health promotion, disease prevention, patient education and counseling, and treatment of acute and chronic medical conditions.    If you need a primary care doctor, please call the below number and ask who is receiving new patients.     [facility]  Telephone:  [phone]  [email or web address]    DOCTOR REFERRALS  Call [phone] (available 24 hours a day, 7 days a week) if you need any further referrals and we can help you find a primary care doctor or specialist.  Also, available online at:  [email or web address]    YOUR CONTACT INFORMATION  Before leaving please check with registration to make sure we have an up-to-date contact number.  You can call registration at [phone] to update your information.  For questions about your hospital bill, please call [phone].  For questions about your Emergency Dept Physician bill please call [phone].      FREE HEALTH SERVICES  If you need help with health or social services, please call 2-1-1 for a free referral to resources in your area.  2-1-1 is a free service connecting people  with information on health insurance, free clinics, pregnancy, mental health, dental care, food assistance, housing, and substance abuse counseling.  Also, available online at:  http://www.[place].org    ORTHOPEDIC INJURY   Please know that significant injuries can exist even when an initial x-ray is read as normal or negative.  This can occur because some fractures (broken bones) are not initially visible on x-rays.  For this reason, close outpatient follow-up with your primary care doctor or bone specialist (orthopedist) is required.    MEDICATIONS AND FOLLOWUP  Please be aware that some prescription medications can cause drowsiness.  Use caution when driving or operating machinery.    The examination and treatment you have received in our Emergency Department is provided on an emergency basis, and is not intended to be a substitute for your primary care physician.  It is important that your doctor checks you again and that you report any new or remaining problems at that time.      ASSISTANCE WITH INSURANCE    Affordable Care Act  [facility])  Call to start or finish an application, compare plans, enroll or ask a question.  [phone]  TTY: [phone]  Web:  Healthcare.gov    Help Enrolling in [facility]  [employer] [place]  857-231-0453 (TOLL-FREE)  ([phone] (TTY)  Web:  Http://www.coverva.org    Local Help Enrolling in the [facility]  [place] [facility]  [phone] (MAIN)  Email:  health-help@nvfs .org  Web:  BlackjackMyths.is  Address:  [phone]  [address] [place], [place] [address]    SEDATING MEDICATIONS  Sedating medications include strong pain medications (e.g. narcotics), muscle relaxers, benzodiazepines (used for anxiety and as muscle relaxers), Benadryl/diphenhydramine and other antihistamines for allergic reactions/itching, and other medications.  If you are unsure if you have received a sedating medication, please ask your physician or nurse.  If you received a sedating  medication: DO NOT drive a car. DO NOT operate machinery. DO NOT perform jobs where you need to be alert.  DO NOT drink alcoholic beverages while taking this medicine.     If you get dizzy, sit or lie down at the first signs. Be careful going up and down stairs.  Be extra careful to prevent falls.     Never give this medicine to others.     Keep this medicine out of reach of children.     Do not take or save old medicines. Throw them away when outdated.     Keep all medicines in a cool, dry place. DO NOT keep them in your bathroom medicine cabinet or in a cabinet above the stove.    MEDICATION REFILLS  Please be aware that we cannot refill any prescriptions through the ER. If you need further treatment from what is provided at your ER visit, please follow up with your primary care doctor or your pain management specialist.    FREESTANDING EMERGENCY DEPARTMENTS OF [facility]  Did you know [facility] has two freestanding ERs located just a few miles away?  [facility] [place] and [facility] [place] have short wait times, easy free parking directly in front of the building and top patient satisfaction scores - and the same Board Certified Emergency Medicine doctors as [facility].

## 2023-07-25 NOTE — ED Provider Notes (Signed)
[facility] DEPART[facility]  ADVANCED PRACTICE PROVIDER HISTORY AND PHYSICAL EXAM     Patient Name: Joy Walker, Joy Walker  Department:[facility]  Encounter Date:  07/25/2023  Attending Physician: No att. providers found   Age: 52 y.o. female  Patient Room: N U05/N U05  PCP: Pcp, None, MD           Diagnosis/Disposition:     Final diagnoses:   Closed nondisplaced fracture of dome of left talus, initial encounter       ED Disposition       ED Disposition   Discharge    Condition   --    Date/Time   Thu Jul 25, 2023 11:30 AM    Comment   Bing Plume discharge to home/self care.    Condition at disposition: Stable                 Follow-Up Providers (if applicable)    Abundio Miu, DPM  [address]  [place] [address]  [phone]    Schedule an appointment as soon as possible for a visit          New Prescriptions    IBUPROFEN (ADVIL) 600 MG TABLET    Take 1 tablet (600 mg) by mouth every 6 (six) hours as needed for Pain           Medical Decision Making:     Initial Differential Diagnosis:  Initial differential diagnosis to include but not limited to: fracture, sprain, bruising, ligamentous injury, tendon injury, muscle injury, dislocation    Plan:  XR left knee and ankle  Ibuprofen   Plan for ace wrap to ankle and knee  Ortho follow up    Final Impression:  The patient was deemed stable for discharge. They were given strict return precautions as it relates to their presumed diagnosis, verbalized understanding of these precautions and agreed to follow up as instructed. All questions were answered prior to discharge.         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    Dw podiatry; would like walking boot placed and follow up outpt    Walking boot applied by tech, checked by me, NV intact  Stable for discharge with podiatry follow up                              History of Presenting Illness:     Nursing Triage note: Pt ambulatory to triage stating "I think I broke my ankle." Pt c/o L ankle pain and swelling after stepping out of house when she twisted her knee and fell. Event happened a few weeks ago. Has hx of torn ACL and meniscus in same knee.  Chief complaint: Ankle Pain and Knee Pain  Joy Walker is a 52 y.o. female with no significant PMHx presents for L ankle and L knee pain. Pt states that over a month ago, she twisted her ankle coming down from a step and fell in a seated position, she did not hit her head, no LOC, no thinner use. Pt states that she might have "broke something or tore something." Pt did  not seek medical assistance until now b/c of insurance issues. Pt is ambulatory and is able to bear weight. Pt states that she has been hearing a popping/clicking sound from her ankle when she ambulates. Reports numbness and tingling over the L ankle and L knee, along with swelling over the lateral ankle and lateral knee. Denies fever/chills, CP, SOB.  Denies any neck or back pain. Denies any paresthesias.           Review of Systems:  Physical Exam:     Review of Systems   Constitutional: Negative.    HENT: Negative.     Eyes: Negative.    Respiratory: Negative.     Cardiovascular: Negative.    Gastrointestinal: Negative.    Genitourinary: Negative.    Musculoskeletal:  Positive for arthralgias.   Skin: Negative.    Neurological: Negative.    Psychiatric/Behavioral: Negative.     All other systems reviewed and are negative.      Positive and  negative ROS per above and in HPI. All other systems reviewed and negative.     Pulse 92  BP (!) 149/106  Resp 15  SpO2 99 %  Temp 97.1 F (36.2 C)     Physical Exam  Vitals and nursing note reviewed.   Constitutional:       General: She is not in acute distress.     Appearance: Normal appearance.   HENT:      Head: Normocephalic and atraumatic.   Cardiovascular:      Rate and Rhythm: Normal rate.      Pulses: Normal pulses.   Pulmonary:      Effort: Pulmonary effort is normal.   Abdominal:      General: Abdomen is flat.   Musculoskeletal:         General: Tenderness present. No swelling, deformity or signs of injury. Normal range of motion.      Cervical back: Normal range of motion. No rigidity.      Comments: Tender to lateral malleoli of left ankle  No swelling   No crepitus or deformities  PMS intact distally     Left lateral knee with mild TTP  No laxity noted  Neg ant/post drawer testing   PMS intact  Normal ROM     Neg thompson test  Ambulatory with steady gait   Skin:     General: Skin is warm.      Capillary Refill: Capillary refill takes less than 2 seconds.   Neurological:      General: No focal deficit present.      Mental Status: She is alert.   Psychiatric:         Mood and Affect: Mood normal.                 Interpretations, Clinical Decision Tools and Critical Care:     O2 Sat:  The patient's oxygen saturation was 99 % on room air. This was independently interpreted by me as Normal.            Procedures:   Procedures      Attestations:  Scribe Attestation: There was no scribe involved in the care of this patient.     Documentation Notes:  Parts of this note were generated by the Epic EMR system/ Dragon speech recognition and may contain inherent errors or omissions not intended by the user. Grammatical errors, random word insertions, deletions, pronoun errors and incomplete sentences are occasional consequences of this technology due to software limitations. Not all errors are caught or  corrected.  My documentation is often completed after the patient is no longer under my clinical care. In some cases, the Epic EMR may pull updated results into the above documentation which may not reflect all results or information that were available to me at the time of my medical decision making.   If there are questions or concerns about the content of this note or information contained within the body of this dictation they should be addressed directly with the author for clarification.                  Lemar Livings, [place]  07/25/23 1130

## 2023-07-29 ENCOUNTER — Encounter (INDEPENDENT_AMBULATORY_CARE_PROVIDER_SITE_OTHER): Payer: Medicaid Other | Admitting: Orthopaedic Surgery

## 2023-08-07 ENCOUNTER — Encounter (INDEPENDENT_AMBULATORY_CARE_PROVIDER_SITE_OTHER): Payer: Self-pay | Admitting: Foot & Ankle Surgery

## 2023-08-07 ENCOUNTER — Ambulatory Visit (INDEPENDENT_AMBULATORY_CARE_PROVIDER_SITE_OTHER): Payer: Medicaid Other | Admitting: Foot & Ankle Surgery

## 2023-08-07 VITALS — Ht 66.0 in | Wt 205.0 lb

## 2023-08-07 DIAGNOSIS — M9390 Osteochondropathy, unspecified of unspecified site: Secondary | ICD-10-CM

## 2023-08-07 MED ORDER — LIDOCAINE 4 % EX PTCH
1.0000 | MEDICATED_PATCH | Freq: Every day | CUTANEOUS | 3 refills | Status: AC
Start: 2023-08-07 — End: 2023-08-27

## 2023-08-07 NOTE — Patient Instructions (Signed)
Fracture Boots can be found on Amazon for reasonable prices if they are not covered by Insurance    Look for a short or tall boot based on the sizing recommendations made by your Doctor    Additionally, a device called an "Even Up" can help with the height discrepancy often caused by the boot.

## 2023-08-07 NOTE — Progress Notes (Signed)
Podiatric Surgery Patient Visit    Patient Name: Joy Walker    Assessment and Plan:     Assessment & Plan  Left Ankle Injury  Persistent pain and clicking sensation since injury in early summer. X-rays suggest possible cartilage damage. Tenderness noted along the anterior portion of the joint and peroneal tendons.  -Order MRI to assess cartilage and soft tissue damage.  -Provide ankle brace to limit joint motion and alleviate discomfort.  -Recommend Lidocaine patches for pain management, prescription to be sent to patient's preferred [facility].    Left Knee Injury  History of torn ACL and meniscus, never surgically repaired. Patient reports possible further injury during recent fall.  -Consider further evaluation pending results of ankle MRI.    Chronic Back Pain  History of multiple disc herniations. Patient reports nerve symptoms extending to upper body.  -Recommend Lidocaine patches for pain management, prescription to be sent to [facility]'s preferred [facility].    Follow-up  Await MRI results. Plan to reach out to patient via portal to discuss results and next steps. If necessary, schedule follow-up appointment to discuss treatment options.      -An MRI/CT was ordered of the left ankle. Instructions on how to obtain and schedule this were given. Patient instructed to wait 48 hours after the exam for follow up in the office, although this can be scheduled as soon as the MRI is scheduled. Patient instructed that they are not obligated to use any specific imaging facility but should they go to any other facility outside of [facility], they will need to bring a disc of the images so that we can see them. They are instructed to call with any questions or concerns.    An ankle brace was fitted and dispensed today via [facility]. I have advised use of the brace at all times of weight bearing.  It can and should be removed when resting/sleeping. ROM to the ankle, knee, toes  recommended.    -Patient instructed to call with any questions, concerns, or worsening of symptoms    Subjective:     Chief Complaint:   Chief Complaint   Patient presents with    Ankle Pain     52 y.o. female has acute pain on left ankle       HPI:   History of Present Illness  The patient, with a history of a torn ACL and meniscus in the left knee, presents with persistent pain in the left ankle following a fall several months ago. The fall, which resulted in the patient being unable to get up for two and a half hours, was due to instability from the pre-existing knee injury. The patient reports a 'clicking' or 'catching' sensation in the ankle when walking, which has not improved since the incident.    The patient was previously given a boot in the emergency room, but found it too cumbersome to use due to the awkwardness it caused with her knee condition. The patient also reports significant swelling following the fall, extending from the knee to the pinky toe. She suspects further injury to the already damaged knee during the fall.    In addition to the ankle and knee issues, the patient has a history of back problems, including multiple herniated discs, resulting from a lifting injury at work. The patient reports general tenderness and weakness in the lower body, with nerve symptoms extending up to the neck.    The patient has previously experienced a  similar injury in the other ankle, involving a broken distal fibula, which did not require surgery. The current symptoms in the left ankle, including popping, locking, and clicking, along with diffuse pain, are suggestive of an osteochondral lesion. The patient also reports tenderness in the perineal tendons of the affected ankle.      DAX Copilot Consent:   The patient and/or family gave verbal consent today for  use of DAX Copilot with electronic transmission using iPhone for the purposes of capturing audio into the patient's chart/designated cloud.     Denies  N/V/F/C/SOB/CP    All other systems were reviewed and are negative    The following portions of the patient's history were reviewed and updated as appropriate: allergies, current medications, past medical history, past surgical history, family history, and problem list.    Physical Exam:   Visit Vitals  Ht 1.676 m (5\' 6" )   Wt 93 kg (205 lb)   BMI 33.09 kg/m       Gen: AAOx3, NAD, Well developed    HEENT: Normocephalic, EOMI    Heart: Regular pulse rate and rhythm    Vascular:  Palpable pedal pulses b/l, CFT to all toes <3 sec. No edema. No varicosities    Derm:  Skin intact without wounds or rashes.  No erythema or ecchymosis.    Neuro:  SILT. Neg tinel's sign.  Able to wiggle toes. No motor deficit    MSK:   MMS 5/5 and symmetric, No other areas of pain or tenderness. No gross deformity. ROM normal without crepitus.    Physical Exam  MUSCULOSKELETAL: Tenderness along anterior portion of left ankle joint and peroneal tendons. Generalized tenderness in lower extremity. Limited range of motion in knee due to discomfort. Tenderness in calf muscle upon palpation.  Clinical findings of the left foot/ankle reveal the following:   -No bruising or swelling is appreciated.  -Compartments are supple and compressible without gross deformity.   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 Sandie Ano, MD 07/25/2023 11:03 AM    Knee 3 View Left    Result Date: 07/25/2023  1.No evidence for fracture or dislocation of the left knee. Sandie Ano, MD 07/25/2023 11:01 AM         Laverna Dossett L. Ranell Patrick, DPM, FACFAS  [facility]  Podiatric Surgery    ------------------------------------------------------------------------------------------------------------------------------  The review of the patient's medications does not in any way constitute an endorsement, by this clinician,  of their use, dosage, indications, route, efficacy, interactions, or other clinical parameters.    This note was generated within the EPIC EMR using Dragon medical speech recognition software and may contain inherent errors or omissions not intended by the user. Grammatical and punctuation errors, random word insertions, deletions, pronoun errors and incomplete sentences are occasional consequences of this technology due to software limitations. Not all errors are caught or corrected.  Although every attempt is made to root out erroneus and incomplete transcription, the note may still not fully represent the intent or opinion of the author. If there are questions or concerns about the content  of this note or information contained within the body of this dictation they should be addressed directly with the author for clarification.

## 2023-08-22 ENCOUNTER — Ambulatory Visit
Admission: RE | Admit: 2023-08-22 | Discharge: 2023-08-22 | Disposition: A | Discharge status: Home / Self Care | Payer: Medicaid Other | Source: Ambulatory Visit | Attending: Foot & Ankle Surgery | Admitting: Foot & Ankle Surgery

## 2023-08-22 DIAGNOSIS — M9390 Osteochondropathy, unspecified of unspecified site: Secondary | ICD-10-CM | POA: Insufficient documentation

## 2023-08-28 ENCOUNTER — Encounter (INDEPENDENT_AMBULATORY_CARE_PROVIDER_SITE_OTHER): Payer: Self-pay | Admitting: Foot & Ankle Surgery

## 2023-09-02 ENCOUNTER — Ambulatory Visit (INDEPENDENT_AMBULATORY_CARE_PROVIDER_SITE_OTHER): Payer: Medicaid Other | Admitting: Foot & Ankle Surgery

## 2023-09-19 ENCOUNTER — Ambulatory Visit (INDEPENDENT_AMBULATORY_CARE_PROVIDER_SITE_OTHER): Payer: Medicaid Other | Admitting: Foot & Ankle Surgery
